# Patient Record
Sex: Male | Born: 1960 | Race: White | Hispanic: No | Marital: Married | Smoking: Former smoker
Health system: Southern US, Community
[De-identification: ages and names within clinical notes are randomized; demographics above are authoritative.]

## PROBLEM LIST (undated history)

## (undated) DIAGNOSIS — F319 Bipolar disorder, unspecified: Secondary | ICD-10-CM

## (undated) DIAGNOSIS — I10 Essential (primary) hypertension: Secondary | ICD-10-CM

## (undated) DIAGNOSIS — E119 Type 2 diabetes mellitus without complications: Secondary | ICD-10-CM

## (undated) DIAGNOSIS — F909 Attention-deficit hyperactivity disorder, unspecified type: Secondary | ICD-10-CM

## (undated) DIAGNOSIS — F419 Anxiety disorder, unspecified: Secondary | ICD-10-CM

## (undated) HISTORY — DX: Essential (primary) hypertension: I10

## (undated) HISTORY — PX: OTHER SURGICAL HISTORY: SHX169

## (undated) HISTORY — DX: Bipolar disorder, unspecified: F31.9

## (undated) HISTORY — DX: Anxiety disorder, unspecified: F41.9

## (undated) HISTORY — DX: Attention-deficit hyperactivity disorder, unspecified type: F90.9

## (undated) HISTORY — DX: Type 2 diabetes mellitus without complications: E11.9

## (undated) HISTORY — PX: BACK SURGERY: SHX140

---

## 2008-08-31 ENCOUNTER — Ambulatory Visit: Payer: Self-pay | Admitting: Family Medicine

## 2008-08-31 DIAGNOSIS — E785 Hyperlipidemia, unspecified: Secondary | ICD-10-CM

## 2008-08-31 DIAGNOSIS — G2581 Restless legs syndrome: Secondary | ICD-10-CM

## 2008-08-31 DIAGNOSIS — E119 Type 2 diabetes mellitus without complications: Secondary | ICD-10-CM

## 2008-08-31 DIAGNOSIS — I10 Essential (primary) hypertension: Secondary | ICD-10-CM | POA: Insufficient documentation

## 2008-08-31 DIAGNOSIS — F3181 Bipolar II disorder: Secondary | ICD-10-CM | POA: Insufficient documentation

## 2008-09-05 ENCOUNTER — Encounter: Payer: Self-pay | Admitting: Family Medicine

## 2008-09-06 LAB — CONVERTED CEMR LAB
Albumin: 4.2 g/dL (ref 3.5–5.2)
Alkaline Phosphatase: 55 units/L (ref 39–117)
BUN: 21 mg/dL (ref 6–23)
CO2: 23 meq/L (ref 19–32)
Cholesterol: 156 mg/dL (ref 0–200)
Glucose, Bld: 136 mg/dL — ABNORMAL HIGH (ref 70–99)
HDL: 25 mg/dL — ABNORMAL LOW (ref >39)
LDL Cholesterol: 54 mg/dL (ref 0–99)
Potassium: 4.3 meq/L (ref 3.5–5.3)
Total Protein: 6.9 g/dL (ref 6.0–8.3)
Triglycerides: 385 mg/dL — ABNORMAL HIGH (ref <150)

## 2008-09-13 ENCOUNTER — Ambulatory Visit: Payer: PRIVATE HEALTH INSURANCE | Admitting: Family Medicine

## 2008-09-17 ENCOUNTER — Encounter: Admission: RE | Admit: 2008-09-17 | Discharge: 2008-09-17 | Payer: Self-pay | Admitting: Family Medicine

## 2008-09-17 ENCOUNTER — Ambulatory Visit: Payer: Self-pay | Admitting: Family Medicine

## 2008-10-02 DIAGNOSIS — H409 Unspecified glaucoma: Secondary | ICD-10-CM | POA: Insufficient documentation

## 2008-10-05 ENCOUNTER — Ambulatory Visit: Payer: Self-pay | Admitting: Family Medicine

## 2008-10-05 DIAGNOSIS — R062 Wheezing: Secondary | ICD-10-CM | POA: Insufficient documentation

## 2008-10-16 ENCOUNTER — Ambulatory Visit: Payer: Self-pay | Admitting: Family Medicine

## 2009-07-26 IMAGING — CR DG CHEST 2V
2 series · 2 of 2 positions shown · non-contrast
Comparison: Prior studies.

CLINICAL DATA: Short of breath cough

CHEST - 2 VIEW

[view not recorded (1 of 2)]
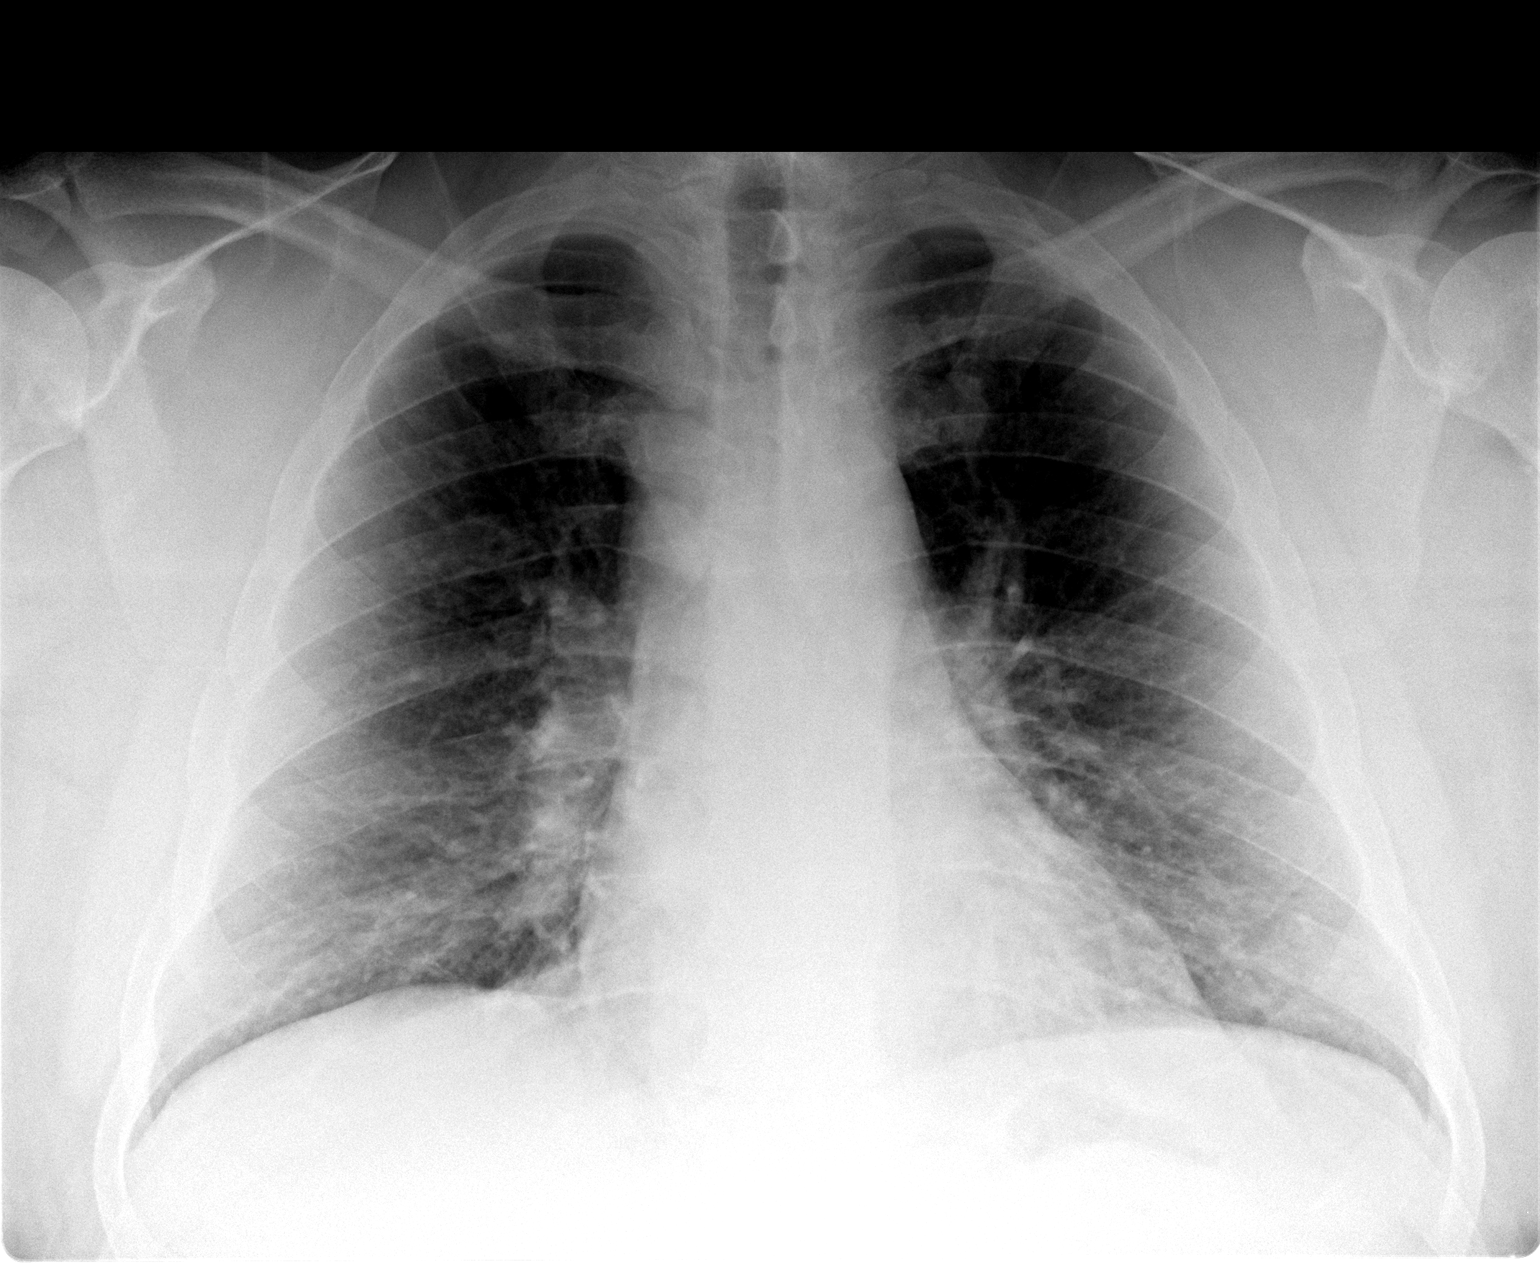

[view not recorded (2 of 2)]
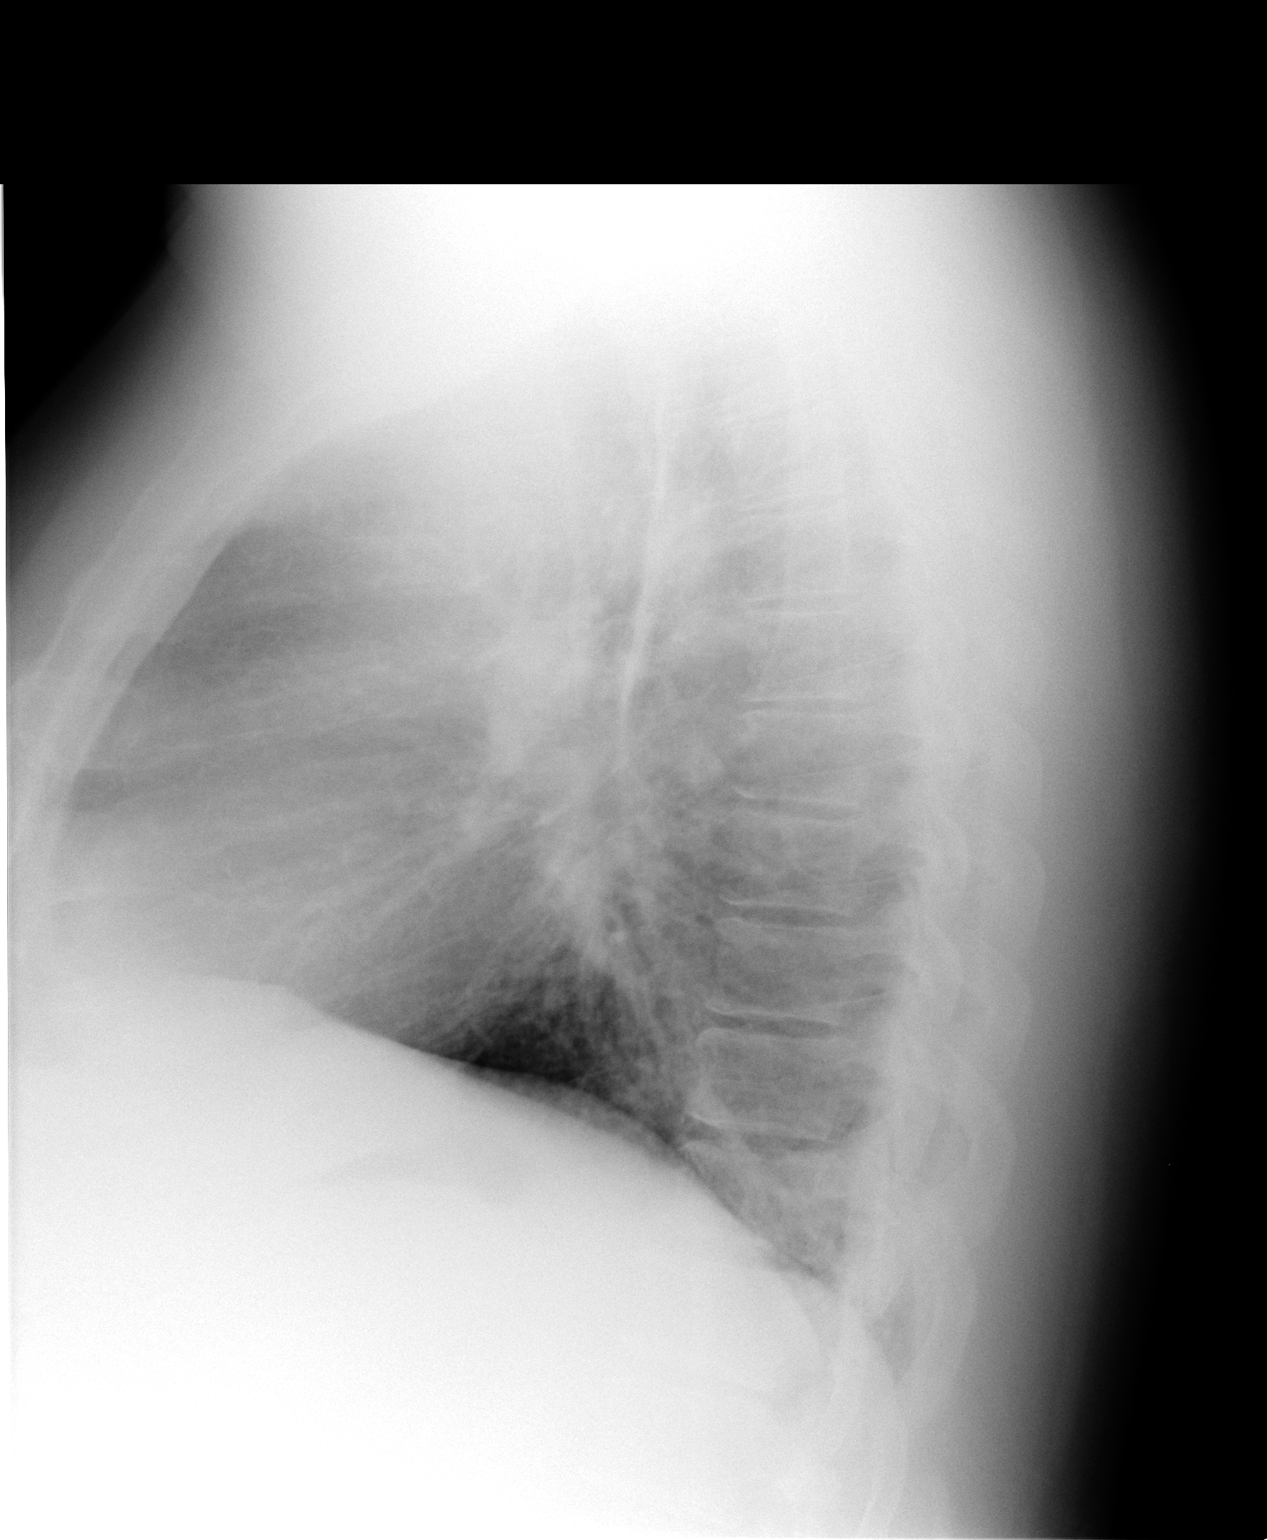

[2 of 2 positions shown; findings below may reference images not displayed]

FINDINGS: There are accentuated bronchial markings present most
consistent with bronchitic change.  There are no acute infiltrates.
There is slight fullness within the right paratracheal region.
Comparison with any prior chest films the patient might have would
be helpful.  This may be secondary to mediastinal fat.  The heart
and mediastinal structures are normal.
IMPRESSION: 1.  Bronchitic changes.
2.  Fullness within the right paratracheal region and superior
mediastinum may be due to mediastinal fat.  Comparison with any
prior films the patient might have would be helpful to exclude
mediastinal adenopathy.

## 2010-12-09 ENCOUNTER — Ambulatory Visit: Payer: Self-pay | Admitting: Family Medicine

## 2010-12-09 LAB — CONVERTED CEMR LAB: Creatinine,U: 100 mg/dL

## 2011-01-05 ENCOUNTER — Encounter: Payer: Self-pay | Admitting: Family Medicine

## 2011-01-06 LAB — CONVERTED CEMR LAB
ALT: 26 units/L (ref 0–53)
AST: 37 units/L (ref 0–37)
Albumin: 4.5 g/dL (ref 3.5–5.2)
CO2: 29 meq/L (ref 19–32)
Calcium: 9.5 mg/dL (ref 8.4–10.5)
Chloride: 95 meq/L — ABNORMAL LOW (ref 96–112)
Potassium: 4.8 meq/L (ref 3.5–5.3)
Sodium: 135 meq/L (ref 135–145)
Total Protein: 7 g/dL (ref 6.0–8.3)

## 2011-01-07 ENCOUNTER — Telehealth (INDEPENDENT_AMBULATORY_CARE_PROVIDER_SITE_OTHER): Payer: Self-pay | Admitting: *Deleted

## 2011-01-09 ENCOUNTER — Ambulatory Visit: Admit: 2011-01-09 | Payer: Self-pay | Admitting: Family Medicine

## 2011-01-22 NOTE — Assessment & Plan Note (Signed)
Summary: Re-estab care for DM, HTN, etc    Vital Signs:  Patient profile:   50 year old male Height:      70.25 inches Weight:      342 pounds Pulse rate:   58 / minute BP sitting:   156 / 86  (right arm) Cuff size:   large  Vitals Entered By: Avon Gully CMA, Duncan Dull) (December 09, 2010 9:12 AM) CC: discuss meds   Primary Care Provider:  Nani Gasser MD  CC:  discuss meds.  History of Present Illness:  Moved to [place] a year ago and now is back. That is why not seen in our office for a year.   Diabetes Management History:      The patient is a 50 years old male who comes in for evaluation of DM Type 2.  He states understanding of dietary principles but he is not following the appropriate diet.        Hypoglycemic symptoms are not occurring.  No hyperglycemic symptoms are reported.  Other comments include: Doesn't check sugars.  .        There are no symptoms to suggest diabetic complications.  Since his last visit, no infections have occurred.  No changes have been made to his treatment plan since last visit.     Current Medications (verified): 1)  Lovastatin 40 Mg Tabs (Lovastatin) .... Take 1 Tablet By Mouth Once A Day 2)  Lisinopril-Hydrochlorothiazide 20-25 Mg Tabs (Lisinopril-Hydrochlorothiazide) .... Take 1 Tablet By Mouth Once A Day 3)  Furosemide 20 Mg Tabs (Furosemide) .... Take 1 Tablet By Mouth Once A Day 4)  Metformin Hcl 750 Mg Xr24h-Tab (Metformin Hcl) .... Take 1 Tablet By Mouth Three Times A Day 5)  Oxcarbazepine 600 Mg Tabs (Oxcarbazepine) .... Take 1 Tablet By Mouth Three Times A Day 6)  Tricor 145 Mg Tabs (Fenofibrate) .... Take 1 Tablet By Mouth Once A Day 7)  Lovaza 1 Gm Caps (Omega-3-Acid Ethyl Esters) .... 4 Tabs By Mouth Daily  Allergies (verified): No Known Drug Allergies  Comments:  Nurse/Medical Assistant: The patient's medications and allergies were reviewed with the patient and were updated in the Medication and Allergy  Lists. Avon Gully CMA, Duncan Dull) (December 09, 2010 9:18 AM)  Physical Exam  General:  Well-developed,well-nourished,in no acute distress; alert,appropriate and cooperative throughout examination Lungs:  Normal respiratory effort, chest expands symmetrically. Lungs are clear to auscultation, no crackles or wheezes. Heart:  Normal rate and regular rhythm. S1 and S2 normal without gallop, murmur, click, rub or other extra sounds. Skin:  no rashes.   Cervical Nodes:  No lymphadenopathy noted Psych:  Cognition and judgment appear intact. Alert and cooperative with normal attention span and concentration. No apparent delusions, illusions, hallucinations  Diabetes Management Exam:    Foot Exam (with socks and/or shoes not present):       Sensory-Monofilament:          Left foot: normal          Right foot: normal   Impression & Recommendations:  Problem # 1:  DIABETES MELLITUS, TYPE II (ICD-250.00) Assessment Deteriorated Says he plans on really working on his diet.  normal monofilament examfrom today A1C isnot well controlled. Change to kombiglyze As he canges his diet and starts to exercise he needs to wean the gimiperide.  F/U in one month. needs to check sugars more regularly.  Neg microalbumin.  Flu vac given today.  The following medications were removed from the medication list:  Aspirin Adult Low Strength 81 Mg Tbec (Aspirin) .Marland Kitchen... Take 1 tablet by mouth once a day His updated medication list for this problem includes:    Lisinopril-hydrochlorothiazide 20-25 Mg Tabs (Lisinopril-hydrochlorothiazide) .Marland Kitchen... Take 1 tablet by mouth once a day    Kombiglyze Xr 04-999 Mg Xr24h-tab (Saxagliptin-metformin) .Marland Kitchen... Take 1 tablet by mouth once a day    Glimepiride 4 Mg Tabs (Glimepiride) .Marland Kitchen... 2 tablets by mouth daily.  Orders: Fingerstick ([address]109) Creatinine  (60454) Hemoglobin A1C (09811) Urine Microalbumin (91478) T-Comprehensive Metabolic Panel (29562-13086) T-Lipid Profile  (57846-96295)  Problem # 2:  ESSENTIAL HYPERTENSION, BENIGN (ICD-401.1) Assessment: Deteriorated NOt at goal on current regimen. Will send in refills and recheck in one month. If not at goal will add a betablocker.  His updated medication list for this problem includes:    Lisinopril-hydrochlorothiazide 20-25 Mg Tabs (Lisinopril-hydrochlorothiazide) .Marland Kitchen... Take 1 tablet by mouth once a day    Furosemide 20 Mg Tabs (Furosemide) .Marland Kitchen... Take 1 tablet by mouth once a day  BP today: 156/86 Prior BP: 132/71 (10/16/2008)  Labs Reviewed: K+: 4.3 (09/05/2008) Creat: : 1.46 (09/05/2008)   Chol: 156 (09/05/2008)   HDL: 25 (09/05/2008)   LDL: 54 (09/05/2008)   TG: 385 (09/05/2008)  Complete Medication List: 1)  Lovastatin 40 Mg Tabs (Lovastatin) .... Take 1 tablet by mouth once a day 2)  Lisinopril-hydrochlorothiazide 20-25 Mg Tabs (Lisinopril-hydrochlorothiazide) .... Take 1 tablet by mouth once a day 3)  Furosemide 20 Mg Tabs (Furosemide) .... Take 1 tablet by mouth once a day 4)  Kombiglyze Xr 04-999 Mg Xr24h-tab (Saxagliptin-metformin) .... Take 1 tablet by mouth once a day 5)  Oxcarbazepine 600 Mg Tabs (Oxcarbazepine) .... Take 1 tablet by mouth three times a day 6)  Tricor 145 Mg Tabs (Fenofibrate) .... Take 1 tablet by mouth once a day 7)  Lovaza 1 Gm Caps (Omega-3-acid ethyl esters) .... 4 tabs by mouth daily 8)  Glimepiride 4 Mg Tabs (Glimepiride) .... 2 tablets by mouth daily.  Other Orders: Flu Vaccine 79yrs + (28413) Admin 1st Vaccine (24401)  Patient Instructions: 1)  If your sugars drop into the 130 then stop one glimiperide. If still doing well then stop the second glimiperide.  2)  Follow up in one months for blood pressure and sugars. Please bring in your meter with you. Prescriptions: LOVAZA 1 GM CAPS (OMEGA-3-ACID ETHYL ESTERS) 4 tabs by mouth daily  #360 Capsule x 3   Entered and Authorized by:   Nani Gasser MD   Signed by:   Nani Gasser MD on 12/09/2010    Method used:   [facility]        [facility] [address] [phone]* (retail)       [address]       [place], [place]  [address]       Ph: [phone] or [phone]       Fax: [phone]   RxID:   [id number] TRICOR 145 MG TABS (FENOFIBRATE) Take 1 tablet by mouth once a day  #90 x 3   Entered and Authorized by:   Nani Gasser MD   Signed by:   Nani Gasser MD on 12/09/2010   Method used:   [facility]        [facility] [phone]* (retail)       [address]       [place], [place]  [address]       Ph: [phone] or [phone]  Fax: [phone]   RxID:   [id number] FUROSEMIDE 20 MG TABS (FUROSEMIDE) Take 1 tablet by mouth once a day  #90 x 3   Entered and Authorized by:   Nani Gasser MD   Signed by:   Nani Gasser MD on 12/09/2010   Method used:   [facility]        [facility] [address] [phone]* (retail)       [address]       [place], [place]  [address]       Ph: [phone] or [phone]       Fax: [phone]   RxID:   [id number] LISINOPRIL-HYDROCHLOROTHIAZIDE 20-25 MG TABS (LISINOPRIL-HYDROCHLOROTHIAZIDE) Take 1 tablet by mouth once a day  #90 x 3   Entered and Authorized by:   Nani Gasser MD   Signed by:   Nani Gasser MD on 12/09/2010   Method used:   [facility]ically [facility]        [facility] [address] [phone]* (retail)       [address]       [place], [place]  [address]       Ph: [phone] or [phone]       Fax: [phone]   RxID:   [id number] LOVASTATIN 40 MG TABS (LOVASTATIN) Take 1 tablet by mouth once a day  #90 x 3   Entered and Authorized by:   Nani Gasser MD   Signed by:   Nani Gasser MD on 12/09/2010   Method used:   [facility]        [facility] [address] [phone]* (retail)       [address]       [place], [place]  [address]       Ph: [phone] or [phone]       Fax: [phone]   RxID:   [id number] OXCARBAZEPINE 600 MG TABS (OXCARBAZEPINE)  Take 1 tablet by mouth three times a day  #270 x 0   Entered and Authorized by:   Nani Gasser MD   Signed by:   Nani Gasser MD on 12/09/2010   Method used:   [facility]        [facility] [address] [phone]* (retail)       [address]       [place], [place]  [address]       Ph: [phone] or [phone]       Fax: [phone]   RxID:   [id number] OXCARBAZEPINE 600 MG TABS (OXCARBAZEPINE) Take 1 tablet by mouth three times a day  #90 x 2   Entered and Authorized by:   Nani Gasser MD   Signed by:   Nani Gasser MD on 12/09/2010   Method used:   [facility]        [facility] [address] [phone]* (retail)       [address]       [place], [place]  [address]       Ph: [phone] or [phone]       Fax: [phone]   RxID:   [id number] KOMBIGLYZE XR 04-999 MG XR24H-TAB (SAXAGLIPTIN-METFORMIN) Take 1 tablet by mouth once a day  #30 x 2   Entered and Authorized by:   Nani Gasser MD   Signed by:   Nani Gasser MD on 12/09/2010   Method used:   [facility] to        [facility] [address] [phone]* (retail)  [address]       [place], [place]  [address]       Ph: [phone] or [phone]       Fax: [phone]   RxID:   [id number]    Orders Added: 1)  Fingerstick [36416] 2)  Creatinine  [82570] 3)  Hemoglobin A1C [83036] 4)  Urine Microalbumin [82044] 5)  T-Comprehensive Metabolic Panel [80053-22900] 6)  T-Lipid Profile [80061-22930] 7)  Flu Vaccine 53yrs + [90658] 8)  Admin 1st Vaccine [90471] 9)  Est. Patient Level IV [33295]   Immunizations Administered:  Influenza Vaccine # 1:    Vaccine Type: Fluvax 3+    Site: left deltoid    Mfr: Sanofi Pasteur    Dose: 0.5 ml    Route: IM    Given by: Sue Lush McCrimmon CMA, (AAMA)    Exp. Date: 05/22/2011    Lot #: [id number]    VIS given: 07/15/10 version given December 09, 2010.  Flu Vaccine Consent Questions:    Do you have a history of severe allergic  reactions to this vaccine? no    Any prior history of allergic reactions to egg and/or gelatin? no    Do you have a sensitivity to the preservative Thimersol? no    Do you have a past history of Guillan-Barre Syndrome? no    Do you currently have an acute febrile illness? no    Have you ever had a severe reaction to latex? no    Vaccine information given and explained to patient? no   Immunizations Administered:  Influenza Vaccine # 1:    Vaccine Type: Fluvax 3+    Site: left deltoid    Mfr: Sanofi Pasteur    Dose: 0.5 ml    Route: IM    Given by: Sue Lush McCrimmon CMA, (AAMA)    Exp. Date: 05/22/2011    Lot #: [id number]    VIS given: 07/15/10 version given December 09, 2010.  Laboratory Results   Urine Tests  Date/Time Received: 12/09/10 Date/Time Reported: 12/09/10  Microalbumin (urine): 30 mg/L Creatinine: 100mg /dL  A:C Ratio 30mg /g  Blood Tests   Date/Time Received: 12/09/10 Date/Time Reported: 12/09/10  HGBA1C: 8.4%   (Normal Range: Non-Diabetic - 3-6%   Control Diabetic - 6-8%)         Appended Document: Re-estab care for DM, HTN, etc     Past History:  Past Medical History: Last updated: 08/31/2008 Bipolar - more manic predominant ADD  Past Surgical History: Last updated: 08/31/2008 Kidney stones removed from ureter at age 73  Family History: Last updated: 08/31/2008 Father prostate Ca late 11s, Hi chol MGM with MI Mother with hi cholesterol, HTN  Social History: Last updated: 08/31/2008 Quarry manager at [employer].  14 rs of education.  Married to [place].  Former Smoker Alcohol use-no Drug use-no

## 2011-01-22 NOTE — Progress Notes (Signed)
Summary: rx. request   Phone Note Call from Patient   Caller: Patient Summary of Call: Needs a Rx. for Test strips and a meter sent to [facility] ... He said he requested this to be completed yesterday and it wasn't completed because he went to his pharmacy and it was not there... Please do as soon as possible and let patient know.. Thanks.[name]  January 07, 2011 12:39 PM  Initial call taken by: [name],  January 07, 2011 12:40 PM    Prescriptions: GLUCOMETER Strips and lancets to test 2 x a day  Dx 250.02  #90 days supp x 3   Entered by:   Payton Spark CMA   Authorized by:   Nani Gasser MD   Signed by:   Payton Spark CMA on 01/07/2011   Method used:   Printed then faxed to ...       [facility] [address] [phone]* (retail)       [address] [place], [place]  [address]       Ph: [phone] or [phone]       Fax: [phone]   RxID:   [id number]    This was faxed yesterday and will be faxed AGAIN today!!

## 2011-03-04 ENCOUNTER — Ambulatory Visit (HOSPITAL_COMMUNITY): Payer: Self-pay | Admitting: Psychiatry

## 2011-03-09 ENCOUNTER — Telehealth: Payer: Self-pay | Admitting: Family Medicine

## 2011-03-16 ENCOUNTER — Other Ambulatory Visit: Payer: Self-pay | Admitting: Family Medicine

## 2011-03-16 MED ORDER — SAXAGLIPTIN-METFORMIN ER 5-1000 MG PO TB24: ORAL_TABLET | ORAL | Status: DC

## 2011-03-19 NOTE — Progress Notes (Signed)
  Phone Note Refill Request Message from:  Fax from Pharmacy on March 09, 2011 4:47 PM  Refills Requested: Medication #1:  KOMBIGLYZE XR 04-999 MG XR24H-TAB Take 1 tablet by mouth once a day Initial call taken by: Avon Gully CMA, Duncan Dull),  March 09, 2011 4:47 PM    Prescriptions: KOMBIGLYZE XR 04-999 MG XR24H-TAB (SAXAGLIPTIN-METFORMIN) Take 1 tablet by mouth once a day  #30 x 0   Entered by:   Avon Gully CMA, (AAMA)   Authorized by:   Nani Gasser MD   Signed by:   Avon Gully CMA, (AAMA) on 03/09/2011   Method used:   [facility]        [facility] [phone]* (retail)       [address] [place], [place]  [address]       Ph: [phone] or [phone]       Fax: [phone]   RxID:   [id number]

## 2011-04-14 ENCOUNTER — Ambulatory Visit (HOSPITAL_COMMUNITY): Payer: Self-pay | Admitting: Psychiatry

## 2011-04-15 ENCOUNTER — Other Ambulatory Visit: Payer: Self-pay | Admitting: Family Medicine

## 2011-05-07 ENCOUNTER — Other Ambulatory Visit: Payer: Self-pay | Admitting: Family Medicine

## 2011-05-13 ENCOUNTER — Other Ambulatory Visit: Payer: Self-pay | Admitting: Family Medicine

## 2011-05-25 ENCOUNTER — Ambulatory Visit (INDEPENDENT_AMBULATORY_CARE_PROVIDER_SITE_OTHER): Payer: Self-pay | Admitting: Physician Assistant

## 2011-05-25 DIAGNOSIS — F3130 Bipolar disorder, current episode depressed, mild or moderate severity, unspecified: Secondary | ICD-10-CM

## 2011-06-09 ENCOUNTER — Ambulatory Visit (HOSPITAL_COMMUNITY): Payer: Self-pay | Admitting: Psychiatry

## 2011-06-22 ENCOUNTER — Encounter (INDEPENDENT_AMBULATORY_CARE_PROVIDER_SITE_OTHER): Payer: PRIVATE HEALTH INSURANCE | Admitting: Physician Assistant

## 2011-06-22 DIAGNOSIS — F339 Major depressive disorder, recurrent, unspecified: Secondary | ICD-10-CM

## 2011-07-10 ENCOUNTER — Ambulatory Visit (INDEPENDENT_AMBULATORY_CARE_PROVIDER_SITE_OTHER): Payer: PRIVATE HEALTH INSURANCE | Admitting: Behavioral Health

## 2011-07-10 DIAGNOSIS — F39 Unspecified mood [affective] disorder: Secondary | ICD-10-CM

## 2011-07-24 ENCOUNTER — Encounter (INDEPENDENT_AMBULATORY_CARE_PROVIDER_SITE_OTHER): Payer: PRIVATE HEALTH INSURANCE | Admitting: Behavioral Health

## 2011-07-24 DIAGNOSIS — F319 Bipolar disorder, unspecified: Secondary | ICD-10-CM

## 2011-08-21 ENCOUNTER — Encounter (INDEPENDENT_AMBULATORY_CARE_PROVIDER_SITE_OTHER): Payer: PRIVATE HEALTH INSURANCE | Admitting: Behavioral Health

## 2011-08-21 DIAGNOSIS — F319 Bipolar disorder, unspecified: Secondary | ICD-10-CM

## 2011-09-14 ENCOUNTER — Encounter (INDEPENDENT_AMBULATORY_CARE_PROVIDER_SITE_OTHER): Payer: PRIVATE HEALTH INSURANCE | Admitting: Physician Assistant

## 2011-09-14 DIAGNOSIS — F309 Manic episode, unspecified: Secondary | ICD-10-CM

## 2011-10-12 ENCOUNTER — Encounter (HOSPITAL_COMMUNITY): Payer: Self-pay | Admitting: Behavioral Health

## 2011-10-12 ENCOUNTER — Encounter (INDEPENDENT_AMBULATORY_CARE_PROVIDER_SITE_OTHER): Payer: PRIVATE HEALTH INSURANCE | Admitting: Behavioral Health

## 2011-10-12 DIAGNOSIS — F39 Unspecified mood [affective] disorder: Secondary | ICD-10-CM

## 2011-10-29 ENCOUNTER — Encounter (HOSPITAL_COMMUNITY): Payer: Self-pay | Admitting: Psychology

## 2011-11-11 ENCOUNTER — Ambulatory Visit (INDEPENDENT_AMBULATORY_CARE_PROVIDER_SITE_OTHER): Payer: PRIVATE HEALTH INSURANCE | Admitting: Behavioral Health

## 2011-11-11 ENCOUNTER — Encounter (HOSPITAL_COMMUNITY): Payer: Self-pay | Admitting: Behavioral Health

## 2011-11-11 DIAGNOSIS — F319 Bipolar disorder, unspecified: Secondary | ICD-10-CM

## 2011-11-16 ENCOUNTER — Encounter (HOSPITAL_COMMUNITY): Payer: Self-pay | Admitting: Behavioral Health

## 2011-11-16 NOTE — Progress Notes (Signed)
   THERAPIST PROGRESS NOTE  Session Time: 3:00  Participation Level: Active  Behavioral Response: Well GroomedAlertAnxious  Type of Therapy: Family Therapy  Treatment Goals addressed: Communication: coping  Interventions: Family Systems  Summary: Cody Barnett is a 50 y.o. male who presents with Bi-polar disorder.   Suicidal/Homicidal: Nowithout intent/plan  Therapist Response: I met with the clients, his wife, and his wife's therapist to discuss communication and marital issues. The client and his wife have been discussing communication issues within the marriage and whether to continue with the marriage. Class wife requested a joint session to discuss her concerns and both the clients wife and her therapist with a good that we all meet together. The session did appear to be productive with both client and his wife being able to provide input [name], the wife's therapist [name] insight and made suggestions or productive ways to make changes in the relationship. Both client and his wife appear to be open to those changes. Majority of the session focused on communication barriers in individual perceptions which in turn had influence on the client reaction to his wife and her reaction to him. The wife's therapist intervened on several occasions to interrupt nonproductive interactions and communication.  Plan: Return again in 2 weeks.  Diagnosis: Axis I: Bipolar, mixed    Axis II: Deferred    [name], [facility] 11/16/2011

## 2011-11-23 ENCOUNTER — Ambulatory Visit (INDEPENDENT_AMBULATORY_CARE_PROVIDER_SITE_OTHER): Payer: PRIVATE HEALTH INSURANCE | Admitting: Psychiatry

## 2011-11-23 ENCOUNTER — Encounter (HOSPITAL_COMMUNITY): Payer: Self-pay | Admitting: Psychiatry

## 2011-11-23 VITALS — BP 141/84 | HR 86 | Ht 71.0 in | Wt 312.0 lb

## 2011-11-23 DIAGNOSIS — Z139 Encounter for screening, unspecified: Secondary | ICD-10-CM

## 2011-11-23 DIAGNOSIS — E1169 Type 2 diabetes mellitus with other specified complication: Secondary | ICD-10-CM

## 2011-11-23 DIAGNOSIS — E119 Type 2 diabetes mellitus without complications: Secondary | ICD-10-CM

## 2011-11-23 DIAGNOSIS — E669 Obesity, unspecified: Secondary | ICD-10-CM

## 2011-11-23 DIAGNOSIS — F3131 Bipolar disorder, current episode depressed, mild: Secondary | ICD-10-CM

## 2011-11-23 DIAGNOSIS — F909 Attention-deficit hyperactivity disorder, unspecified type: Secondary | ICD-10-CM

## 2011-11-23 DIAGNOSIS — F902 Attention-deficit hyperactivity disorder, combined type: Secondary | ICD-10-CM

## 2011-11-23 MED ORDER — METHYLPHENIDATE HCL ER (OSM) 27 MG PO TBCR: 27.0000 mg | EXTENDED_RELEASE_TABLET | Freq: Every day | ORAL | Status: DC

## 2011-11-23 MED ORDER — OXCARBAZEPINE 600 MG PO TABS: ORAL_TABLET | ORAL | Status: DC

## 2011-11-23 MED ORDER — LAMOTRIGINE 100 MG PO TABS: 100.0000 mg | ORAL_TABLET | Freq: Two times a day (BID) | ORAL | Status: DC

## 2011-11-23 NOTE — Patient Instructions (Signed)
Continue withLlamical and Trileptal and StartC Discussion of Concerta   Risks, Benefits and Alternative Treatments  are discussed and pt  agreed  Call if Concerta  demonstrates any side effects.   Suicide risk assessment - minimal  "It's never been an option" Return In 5 weeks  Jan 4th 2013

## 2011-11-23 NOTE — Progress Notes (Signed)
Psychiatric Assessment Adult  Patient Identification:  Cody Barnett Date of Evaluation:  11/23/2011 Chief Complaint: some irritability, some impulsivity History of Chief Complaint:   Chief Complaint  Patient presents with  . Manic Behavior    HPI Review of Systems Physical Exam  Depressive Symptoms: depressed mood and weight gain  (Hypo) Manic Symptoms:   Elevated Mood:  No Irritable Mood:  Yes Grandiosity:  No Distractibility:  Yes Labiality of Mood:  Yes Delusions:  No Hallucinations:  No Impulsivity:  No Sexually Inappropriate Behavior:  No Financial Extravagance:  No Flight of Ideas:  No  Anxiety Symptoms: Excessive Worry:  Yes Panic Symptoms:  No Agoraphobia:  No Obsessive Compulsive: No  Symptoms: 0 Specific Phobias:  No Social Anxiety:  No  Psychotic Symptoms:  Hallucinations: No None Delusions:  No Paranoia:  No   Ideas of Reference:  No  PTSD Symptoms: Ever had a traumatic exposure:  No Had a traumatic exposure in the last month:  No Re-experiencing: No None Hypervigilance:  No Hyperarousal: No None Avoidance: No None  Traumatic Brain Injury: No 0  Past Psychiatric History: Diagnosis: Bipolar, ADHD, ADD current  Hospitalizations: 0  Outpatient Care: [facility]  Substance Abuse Care: Remote  Self-Mutilation: 0  Suicidal Attempts: 0  Violent Behaviors: extreme anger   Past Medical History:   Past Medical History  Diagnosis Date  . Bipolar disorder   . Anxiety    History of Loss of Consciousness:  No Seizure History:  No Cardiac History:  No Allergies:  No Known Allergies Current Medications:  Current Outpatient Prescriptions  Medication Sig Dispense Refill  . KOMBIGLYZE XR 04-999 MG TB24       . lamoTRIgine (LAMICTAL) 100 MG tablet Take 1 tablet (100 mg total) by mouth 2 (two) times daily.  60 tablet  0  . LOVAZA 1 G capsule       . oxcarbazepine (TRILEPTAL) 600 MG tablet TAKE 1/ 12/ 2 x DAILY  270 tablet  0  . methylphenidate  (CONCERTA) 27 MG CR tablet Take 1 tablet (27 mg total) by mouth daily.  30 tablet  0    Previous Psychotropic Medications:  Medication Dose   Desipramine  ?  Nortriptyline ?  Wellbutrin ?               Substance Abuse History in the last 12 months: Substance Age of 1st Use Last Use Amount Specific Type  Nicotine      Alcohol      Cannabis      Opiates      Cocaine      Methamphetamines      LSD      Ecstasy      Benzodiazepines      Caffeine      Inhalants      Others:                             Social History: Current Place of Residence:Place of Birth: [place]  Family Members: 4 Marital Status:  Married Children: 2  Sons: 1  Daughters: 1 Relationships: ex-wife and older son, never treated for ADHD Education:  unknown Educational Problems/Performance: affected by ADHD Religious Beliefs/Practices: hyperactive, treated at [facility] History of Abuse: unknown Pharmacist, hospital History: unknown Legal History: unknown Hobbies/Interests: computers  Family History:   Family History  Problem Relation Age of Onset  . ADD / ADHD Mother   . ADD /  ADHD Brother   . ADD / ADHD Son   . ADD / ADHD Son   . ADD / ADHD Daughter     Mental Status Examination/Evaluation: Objective:  Appearance: Fairly Groomed  Eye Contact::  Fair  Speech:  Normal Rate  Volume:  Normal  Mood:  Early guarded, then more relaxed  Affect:  Full Range  Thought Process:  Goal Directed  Orientation:  Full  Thought Content:  organized normal  Suicidal Thoughts:  No  Homicidal Thoughts:  No  Judgement:  Intact  Insight:  Fair  Psychomotor Activity:  Normal  Akathisia:  No  Handed:  Right  AIMS (if indicated):  0  Assets:  Communication Skills Desire for Improvement Financial Resources/Insurance Housing Intimacy Social Support Vocational/Educational    Laboratory/X-Ray Psychological Evaluation(s)   FASTING CBC, CMET, LIPIDS, Oxcarbazepine level,  HgbA1c     Assessment:  Pt is seen by CP and with Wife KL.  He is morbidly obese, in casual clothes and initially avoids eye contact.  He is aware of his medications and says he takes them.  He is aware that he can be very irritable when he skips doses of mood regulation medications.  He says he has bipolar mood swings.  Without meds he can be very agitated.  He describes his depression as mild.  He invites option to treat his ADD, once ADHD, very hyperactive as a child.  He was treated at [facility].  He has not taken medication recently but has taken Wellbutrin Nortipryline Desipramine earlier He agrees to an increase in Lamictal to prolong recurrence of depression and no change in Trileptal.  He denies tobacco, alcohol, drugs.  He denies suicidal thoughts.  He works for a Engineer, maintenance (IT).  He is married to [place] Barnett, second marriage and has 2 adopted children, both Medical sales representative.  He agrees to start a low dose Concerta to test his tolerability.  If agreeable, the dose will be gradually increased.  He admits to impulsivity, action-wise and verbally.  He says he has high blood pressure and diabetes; high cholesterol too.  He ends session, fairly agreeable and agrees to get his FASTING blood test Dec. 28, 2012             AXIS I ADHD, combined type,Bipolar -irritable/depressed Disorder  AXIS II Deferred  AXIS III Past Medical History  Diagnosis Date  . Bipolar disorder   . Anxiety   Morbid obesity, hypercholesterolemia, hypertension, diabetes type 2,   AXIS IV problems related to social environment  AXIS V 51-60 moderate symptoms   Treatment Plan/Recommendations:  Plan of Care: Check Trileptal level, advise about weight reduction, monitor moods and ADD symptoms;  Medication management, individual and couple therapy  Laboratory:  CBC Chemistry Profile HbAIC Lipids, oxcarbazepine level  Psychotherapy: CP and KL  Medications: Trileptal, Lamictal, Concerta  Routine PRN  Medications:  No  Consultations: 0  Safety Concerns:  Minimal; denies SI/HI  Other:      Mickeal Skinner, MD 12/3/20125:00 PM

## 2011-11-26 ENCOUNTER — Ambulatory Visit (INDEPENDENT_AMBULATORY_CARE_PROVIDER_SITE_OTHER): Payer: PRIVATE HEALTH INSURANCE | Admitting: Behavioral Health

## 2011-11-26 DIAGNOSIS — F319 Bipolar disorder, unspecified: Secondary | ICD-10-CM

## 2011-11-27 ENCOUNTER — Encounter (HOSPITAL_COMMUNITY): Payer: Self-pay | Admitting: Behavioral Health

## 2011-11-27 NOTE — Progress Notes (Signed)
   THERAPIST PROGRESS NOTE  Session Time: 11:00  Participation Level: Active  Behavioral Response: Fairly GroomedAlertAnxious  Type of Therapy: Individual Therapy  Treatment Goals addressed: Anxiety  Interventions: CBT  Summary: Cody Barnett is a 50 y.o. male who presents with bi-polar disorder.   Suicidal/Homicidal: Nowithout intent/plan  Therapist Response: Previous session with the client involved [name] his wife's therapist and his wife. We spent some time reflecting on that session. He indicated that he felt was productive and that they have found some ways to better communicate with each other. He does indicate that to process and is taking some time for him to realize his deficiencies and indicates that his time he is much more aware of his wife's deficiencies as opposed to his own. He did indicate that they're beginning to address whether financial issues. He reported that they have gotten themselves into debt with credit cards and he is now setting aside a percentage of his and his wife's check to be in pain down the credit card. He realizes that financial stress is a big part of what is going on with he and his wife. He indicates that he and his wife have 2 different types of anger were as his wife is very verbal he is physical. He indicates that he hits whereas or does things to get his anger out quickly and then typically he's done with it. He recognizes this is not healthy and the impact it is having on his family as well as his children. He indicated he is not physical toward his family. He indicates that his wife's anger is more verbal and he feels that she allows herself to become too angry too quickly and often on Saturdays which is toward a yells at their 2 children if they are not doing chores quickly enough. He indicates that he feels at times fearful of her because he feels he cannot say anything to her without her becoming angry. We talked about ways based on  the last session how he can communicate in a healthy way how he is feeling about his wife's anger while at the same time acknowledging he is to her. We spent significant time again talking about improving communication and taking a look at perspective peer also recommended again that he read the book and boundaries. He indicated that he thinks he will download that book to both his and his wife's tablets for Christmas and jokingly said he would address them from this counselor and his wife counselor. I also recommended Sundra Aland as far as sound financial advice from a spiritual perspective  Plan: Return again in 2 weeks.  Diagnosis: Axis I: Bipolar, mixed    Axis II: Deferred    Khyrie Masi M, LPC 11/27/2011

## 2011-11-30 ENCOUNTER — Telehealth: Payer: Self-pay | Admitting: Family Medicine

## 2011-11-30 NOTE — Telephone Encounter (Signed)
Patient called scheduled an appointment for 12/09/11 for med management but request to have a lab order for standard labs,diabeties faxed over to [phone] he can get the labs done free as long as the order is faxed there.Marland KitchenMarland KitchenMarland Kitchen

## 2011-12-04 ENCOUNTER — Telehealth: Payer: Self-pay | Admitting: *Deleted

## 2011-12-04 NOTE — Telephone Encounter (Signed)
Pt has an appt scheduled for DM f/u next week and wants labs done at another lab because it will be free for him;it appears that Dr. Ferol Luz already put in an order for him

## 2011-12-08 ENCOUNTER — Encounter: Payer: Self-pay | Admitting: Family Medicine

## 2011-12-09 ENCOUNTER — Ambulatory Visit (INDEPENDENT_AMBULATORY_CARE_PROVIDER_SITE_OTHER): Payer: PRIVATE HEALTH INSURANCE | Admitting: Family Medicine

## 2011-12-09 ENCOUNTER — Encounter: Payer: Self-pay | Admitting: Family Medicine

## 2011-12-09 DIAGNOSIS — E119 Type 2 diabetes mellitus without complications: Secondary | ICD-10-CM

## 2011-12-09 DIAGNOSIS — Z23 Encounter for immunization: Secondary | ICD-10-CM

## 2011-12-09 DIAGNOSIS — Z1211 Encounter for screening for malignant neoplasm of colon: Secondary | ICD-10-CM

## 2011-12-09 LAB — POCT GLYCOSYLATED HEMOGLOBIN (HGB A1C): Hemoglobin A1C: 11.8

## 2011-12-09 LAB — POCT UA - MICROALBUMIN

## 2011-12-09 MED ORDER — SAXAGLIPTIN-METFORMIN ER 5-1000 MG PO TB24: 1.0000 | ORAL_TABLET | Freq: Every day | ORAL | Status: DC

## 2011-12-09 MED ORDER — EXENATIDE ER 2 MG ~~LOC~~ SUSR: 2.0000 mg | SUBCUTANEOUS | Status: DC

## 2011-12-09 NOTE — Progress Notes (Signed)
  Subjective:    Patient ID: Cody Barnett, male    DOB: January 12, 1961, 50 y.o.   MRN: [id number]  Diabetes He presents for his follow-up diabetic visit. He has type 2 diabetes mellitus. His disease course has been stable. There are no hypoglycemic associated symptoms. Pertinent negatives for diabetes include no blurred vision, no foot paresthesias, no foot ulcerations, no polydipsia, no polyphagia, no polyuria, no visual change and no weight loss. There are no hypoglycemic complications. Symptoms are stable. Current diabetic treatment includes oral agent (dual therapy). He is compliant with treatment all of the time. He is following a generally healthy diet. He has not had a previous visit with a dietician. He participates in exercise weekly. There is no change (not checking his sugar.  ) in his home blood glucose trend. An ACE inhibitor/angiotensin II receptor blocker is being taken. Eye exam is current.    Review of Systems  Constitutional: Negative for weight loss.  Eyes: Negative for blurred vision.  Genitourinary: Negative for polyuria.  Hematological: Negative for polydipsia and polyphagia.       Objective:   Physical Exam  Constitutional: He is oriented to person, place, and time. He appears well-developed and well-nourished.  HENT:  Head: Normocephalic and atraumatic.  Cardiovascular: Normal rate, regular rhythm and normal heart sounds.   Pulmonary/Chest: Effort normal and breath sounds normal.  Neurological: He is alert and oriented to person, place, and time.  Skin: Skin is warm and dry.  Psychiatric: He has a normal mood and affect. His behavior is normal.          Assessment & Plan:  DM-his diabetes is very poorly controlled. We discussed getting back onto a low carb and sugar diet. Also getting more regular exercise that he is doing some walking pretty routinely. We also discussed starting byderon once weekly injection. H.O given.  F/U in 1 mo.  Rember to get eye exam  in Jan.  Foot exam performed today and microalbumin.  Lab Results  Component Value Date   HGBA1C 11.8 12/09/2011   Tdap and flu given today.    Discussed need for colonscopy. Will make referral.

## 2011-12-25 ENCOUNTER — Encounter (HOSPITAL_COMMUNITY): Payer: Self-pay | Admitting: Psychiatry

## 2011-12-25 ENCOUNTER — Telehealth: Payer: Self-pay | Admitting: *Deleted

## 2011-12-25 ENCOUNTER — Ambulatory Visit (HOSPITAL_COMMUNITY): Payer: Self-pay | Admitting: Psychiatry

## 2011-12-25 ENCOUNTER — Ambulatory Visit (INDEPENDENT_AMBULATORY_CARE_PROVIDER_SITE_OTHER): Payer: PRIVATE HEALTH INSURANCE | Admitting: Psychiatry

## 2011-12-25 VITALS — BP 134/84 | HR 87 | Ht 71.0 in | Wt 314.0 lb

## 2011-12-25 DIAGNOSIS — F902 Attention-deficit hyperactivity disorder, combined type: Secondary | ICD-10-CM

## 2011-12-25 DIAGNOSIS — F3175 Bipolar disorder, in partial remission, most recent episode depressed: Secondary | ICD-10-CM

## 2011-12-25 DIAGNOSIS — F900 Attention-deficit hyperactivity disorder, predominantly inattentive type: Secondary | ICD-10-CM

## 2011-12-25 DIAGNOSIS — F909 Attention-deficit hyperactivity disorder, unspecified type: Secondary | ICD-10-CM

## 2011-12-25 DIAGNOSIS — F3131 Bipolar disorder, current episode depressed, mild: Secondary | ICD-10-CM

## 2011-12-25 DIAGNOSIS — F988 Other specified behavioral and emotional disorders with onset usually occurring in childhood and adolescence: Secondary | ICD-10-CM

## 2011-12-25 MED ORDER — OXCARBAZEPINE 600 MG PO TABS: ORAL_TABLET | ORAL | Status: DC

## 2011-12-25 MED ORDER — SITAGLIP PHOS-METFORMIN HCL ER 100-1000 MG PO TB24: 1.0000 | ORAL_TABLET | Freq: Every day | ORAL | Status: DC

## 2011-12-25 MED ORDER — METHYLPHENIDATE HCL ER (OSM) 36 MG PO TBCR: 36.0000 mg | EXTENDED_RELEASE_TABLET | Freq: Every day | ORAL | Status: DC

## 2011-12-25 MED ORDER — LAMOTRIGINE 100 MG PO TABS: 100.0000 mg | ORAL_TABLET | Freq: Two times a day (BID) | ORAL | Status: DC

## 2011-12-25 NOTE — Progress Notes (Signed)
Patient ID: Cody Barnett, male   DOB: Jul 27, 1961, 51 y.o.   MRN: [id number] Cody Barnett has come for an appointment after the long vacation holiday and is saying that he had more time than it usually needed but has had some family time and time to do at home projects. He reports that he has taken the initiative to talk with his wife and with his thoughts to explain more the range of emotions he might have and that has contributed to more understanding and I believe patient's with with the relationships he has at home and work. He is trying to be very calm and at this time it would be observed that the bipolar disorder is in partial remission. He has not had his lab tests taken so today the labs will be reordered and she is encouraged to prepare her locally to take a fasting test at [facility]. He has not expressed any thoughts of deep depression or suicidal thoughts. He has not had any extreme irritability episodes. He continues to engage in therapy with Serafina Mitchell and with his wife will see Fay Records & Serafina Mitchell.

## 2011-12-25 NOTE — Patient Instructions (Signed)
You have been given an increase in your Concerta medication and a 90 day supply of the Lamictal and Trileptal. UF also been requested to prepare for a fasting blood test on Monday the seventh by Sunday night, do not eat anything after midnight or drink anything water until you walk in for your test in the morning. Continue your therapy with Serafina Mitchell and your combined therapy however it's arranged with Fay Records.

## 2011-12-25 NOTE — Telephone Encounter (Signed)
LM for pt in regards to new medication.  MA, LPN

## 2011-12-25 NOTE — Telephone Encounter (Signed)
I changed his prescription to Janumet X R. and sent it over to his pharmacy. According to our system this should be on his plan now.

## 2011-12-25 NOTE — Telephone Encounter (Signed)
Pt would like to know if there was a generic for Kombiglyze or similar med due to the fact his insurance won't cover this med. Please advise.  MA, LPN

## 2012-01-11 ENCOUNTER — Ambulatory Visit: Payer: Self-pay | Admitting: Family Medicine

## 2012-01-11 DIAGNOSIS — Z0289 Encounter for other administrative examinations: Secondary | ICD-10-CM

## 2012-01-25 ENCOUNTER — Ambulatory Visit (INDEPENDENT_AMBULATORY_CARE_PROVIDER_SITE_OTHER): Payer: PRIVATE HEALTH INSURANCE | Admitting: Psychiatry

## 2012-01-25 ENCOUNTER — Encounter (HOSPITAL_COMMUNITY): Payer: Self-pay | Admitting: Psychiatry

## 2012-01-25 VITALS — BP 140/70 | HR 88 | Ht 70.0 in | Wt 311.0 lb

## 2012-01-25 DIAGNOSIS — F909 Attention-deficit hyperactivity disorder, unspecified type: Secondary | ICD-10-CM

## 2012-01-25 DIAGNOSIS — F3160 Bipolar disorder, current episode mixed, unspecified: Secondary | ICD-10-CM

## 2012-01-25 DIAGNOSIS — F3131 Bipolar disorder, current episode depressed, mild: Secondary | ICD-10-CM

## 2012-01-25 MED ORDER — METHYLPHENIDATE HCL ER (OSM) 27 MG PO TBCR: 27.0000 mg | EXTENDED_RELEASE_TABLET | Freq: Every day | ORAL | Status: DC

## 2012-01-25 MED ORDER — OXCARBAZEPINE 600 MG PO TABS: ORAL_TABLET | ORAL | Status: DC

## 2012-01-25 MED ORDER — LAMOTRIGINE 100 MG PO TABS: 100.0000 mg | ORAL_TABLET | Freq: Two times a day (BID) | ORAL | Status: DC

## 2012-01-25 NOTE — Patient Instructions (Addendum)
YOUR MEDICATION IS ORDERED.   You have been feeling more positive and more aware of mood.  You  Are encouraged to call 911  ED or [facility] [phone].  Make an appt with Dr. Demetrius Charity for March or April.  Continue therapy with CP

## 2012-01-25 NOTE — Progress Notes (Signed)
Patient ID: Cody Barnett, male   DOB: 18-May-1961, 51 y.o.   MRN: [id number] Cody Barnett states that he is feeling much more comfortable recognizing that he can have rapid mood swings and become irritable quickly. He has confided in his family close friends and coworkers that he recognizes this condition and is more willing to accept their feedback if he starts to get a bit irritable R. aggressive. He states that his therapy has been extremely helpful. He is concerned about his daughter's dyslexia and is trying to work with the school to improve her process of learning. He is also concerned about his wife who has difficulty balancing work responsibilities and parenting needs. She is hoping to be home more in the afternoon with the children and both she and Cody Barnett are pleased with this possibility. He does not to have any suicidal/homicidal statements and is in a very "mellow" mood today. He is taking his medications and states that at times he has a relaxing day he does not necessarily take his Concerta. He does acknowledge that in contrast, he makes sure that the children take her medications daily. He is informed about the new psychiatrist and states that he will call when he needs his Concerta prescription for March.

## 2012-03-17 ENCOUNTER — Other Ambulatory Visit (HOSPITAL_COMMUNITY): Payer: Self-pay

## 2012-03-17 ENCOUNTER — Telehealth (HOSPITAL_COMMUNITY): Payer: Self-pay | Admitting: Psychiatry

## 2012-03-17 DIAGNOSIS — F900 Attention-deficit hyperactivity disorder, predominantly inattentive type: Secondary | ICD-10-CM

## 2012-03-17 MED ORDER — METHYLPHENIDATE HCL ER (OSM) 36 MG PO TBCR: 36.0000 mg | EXTENDED_RELEASE_TABLET | Freq: Every day | ORAL | Status: DC

## 2012-03-17 NOTE — Telephone Encounter (Signed)
Spoke to patient. He reports he had been taking Concerta 36 mg. Patient denies SI/HI/AVH or medication side effects. Will fill one month Concerta 36 mg #30 No refills, to last till his next appointment.

## 2012-03-18 NOTE — Telephone Encounter (Signed)
Patient called, and was informed that he would receive refill of Concerta till his next appointment.

## 2012-03-24 ENCOUNTER — Encounter (HOSPITAL_COMMUNITY): Payer: Self-pay | Admitting: Psychiatry

## 2012-03-24 ENCOUNTER — Ambulatory Visit (INDEPENDENT_AMBULATORY_CARE_PROVIDER_SITE_OTHER): Payer: PRIVATE HEALTH INSURANCE | Admitting: Psychiatry

## 2012-03-24 VITALS — BP 111/82 | HR 75 | Ht 70.5 in | Wt 312.0 lb

## 2012-03-24 DIAGNOSIS — F3131 Bipolar disorder, current episode depressed, mild: Secondary | ICD-10-CM

## 2012-03-24 DIAGNOSIS — Q230 Congenital stenosis of aortic valve: Secondary | ICD-10-CM | POA: Insufficient documentation

## 2012-03-24 MED ORDER — OXCARBAZEPINE 600 MG PO TABS: ORAL_TABLET | ORAL | Status: DC

## 2012-03-24 MED ORDER — LAMOTRIGINE 100 MG PO TABS: 100.0000 mg | ORAL_TABLET | Freq: Two times a day (BID) | ORAL | Status: DC

## 2012-03-24 NOTE — Progress Notes (Addendum)
Psychiatric Follow Up:  Patient Identification:  Cody Barnett Date of Evaluation:  03/24/2012   HPI Comments: SUBJECTIVE: Cody Barnett is a 51 year old male with a diagnosis of Attention Deficit Hyperactivity Disorder, Hyperactive type and Bipolar II Disorder, mixed. The patient says he feels "pretty well". Cody Barnett says he reports that he feels his is doing well. He reports that his is doing well at work.  He reports he has skipped medications on weekends if he has not needed it.  The patient states he is taking all his medications and denies any side effects from his medications other than dry mouth. He denies any other complaints. He reports that his greatest stressor is his children's problems. He admits to poor compliance to management of diabetes and reports a previous HbA1c this year of over 10.  In the area of affective symptoms, patient appears euthymic. Patient denies current suicidal ideation, intent, or plan. Patient denies current homicidal ideation, intent, or plan.    Review of Systems  Constitutional: Negative for appetite change and unexpected weight change.  Respiratory: Negative for shortness of breath.   Cardiovascular: Negative for chest pain.  Gastrointestinal: Negative for constipation.  Psychiatric/Behavioral: Negative for suicidal ideas, hallucinations and decreased concentration.   Depressive Symptoms: patient denies.  (Hypo) Manic Symptoms:   Elevated Mood:  Yes Irritable Mood:  Yes Grandiosity:  Yes Distractibility:  No Lability of Mood:  Yes Delusions:  No Hallucinations:  No Impulsivity:  Yes Sexually Inappropriate Behavior:  No Financial Extravagance:  Yes Flight of Ideas:  No  Anxiety Symptoms: Excessive Worry:  Yes Panic Symptoms:  No Agoraphobia:  Yes Obsessive Compulsive: No  Symptoms: None, Specific Phobias:  No Social Anxiety:  Yes  Psychotic Symptoms:  Hallucinations: No None Delusions:  No Paranoia:  No   Ideas  of Reference:  No  PTSD Symptoms: Ever had a traumatic exposure:  No Had a traumatic exposure in the last month:  No Re-experiencing: No None Hypervigilance:  No Hyperarousal: No None Avoidance: No None  Traumatic Brain Injury: No    Physical Exam  Constitutional: He appears well-developed and well-nourished. No distress.  Skin: He is not diaphoretic.   Past Psychiatric History:  Diagnosis: Bipolar, ADHD, ADD current   Hospitalizations: None  Outpatient Care: [facility]   Substance Abuse Care: Remote   Self-Mutilation:Patient denies  Suicidal Attempts: Patient denies  Violent Behaviors: extreme anger     Past Medical History:   Past Medical History  Diagnosis Date  . Bipolar disorder   . Anxiety   . ADD (attention deficit disorder with hyperactivity)    History of Loss of Consciousness: No  Seizure History: No  Cardiac History: No  Allergies: No Known Allergies  Current Medications:    History of Loss of Consciousness:  No Seizure History:  No Cardiac History:  Yes-Aortic Stenosis Allergies:  No Known Allergies  Current Medications:  Current Outpatient Prescriptions  Medication Sig Dispense Refill  . Exenatide (BYDUREON) 2 MG SUSR Inject 2 mg into the skin once a week.  4 each  3  . lamoTRIgine (LAMICTAL) 100 MG tablet Take 1 tablet (100 mg total) by mouth 2 (two) times daily.  180 tablet  1  . lisinopril-hydrochlorothiazide (PRINZIDE,ZESTORETIC) 10-12.5 MG per tablet Take 1 tablet by mouth daily.        Marland Kitchen LOVAZA 1 G capsule       . methylphenidate (CONCERTA) 36 MG CR tablet Take 1 tablet (36 mg total) by mouth daily.  30 tablet  0  . oxcarbazepine (TRILEPTAL) 600 MG tablet TAKE 1/ 12/ 2 x DAILY  270 tablet  0  . SitaGLIPtin-MetFORMIN HCl (JANUMET XR) [phone] MG TB24 Take 1 tablet by mouth daily.  30 tablet  3    Previous Psychotropic Medications:  Medication Dose   Desipramine   unknown  Nortriptyline  unknown  Wellbutrin  unknown   Substance Abuse History  in the last 12 months:  Caffeine: . Caffeine Beverages 0-24 ounces per day. Nicotine: Patient denies.  Alcohol: Patient denies.  Illicit Drugs: Patient denies.    Blackouts:  No DT's:  No Withdrawal Symptoms:  No   Social History: Current Place of Residence:Place of Birth: [name]  Family Members: 4  Marital Status: Married  Children: 2  Sons: 1  Daughters: 1  Relationships: ex-wife and older son, never treated for ADHD  Education: unknown  Educational Problems/Performance: affected by ADHD, hyperactive, treated at [facility]  Religious Beliefs/Practices: Former Banker; [employer] History of Abuse: unknown  Pharmacist, hospital History: 6 month-medical discharge Legal History: Patient denies. Hobbies/Interests: computers  Family History:   Family History  Problem Relation Age of Onset  . ADD / ADHD Mother   . Hyperlipidemia Mother   . Hypertension Mother   . ADD / ADHD Brother   . ADD / ADHD Son   . ADD / ADHD Son   . ADD / ADHD Daughter   . Prostate cancer Father 67  . Heart attack Maternal Grandmother     Mental Status Examination/Evaluation: Objective:  Appearance: Casual  Eye Contact::  Good  Speech:  Clear and Coherent and Normal Rate  Volume:  Normal  Mood:  "pretty well"  Affect:  Appropriate  Thought Process:  Circumstantial  Orientation:  Full  Thought Content:  WDL  Suicidal Thoughts:  No  Homicidal Thoughts:  No  Judgement:  Fair  Insight:  Good  Psychomotor Activity:  Normal  Akathisia:  No  Handed:  Right  AIMS (if indicated):  Not indicated  Assets:  Communication Skills Desire for Improvement Financial Resources/Insurance Housing Social Support Talents/Skills Transportation Vocational/Educational     Assessment:   AXIS I Bipolar II disorder, most recent episode hypomanic, Attention Deficit Hyperactivity Disorder  AXIS II No diagnosis  AXIS III Aortic Stenosis, congenital  AXIS IV  economic problems, other psychosocial or environmental problems, problems related to social environment and problems with primary support group  AXIS V 51-60 moderate symptoms   Treatment Plan/Recommendations:   PLAN:  1. Affirm with the patient that the medications are taken as ordered. Patient  expressed understanding of how their medications were to be used.  2. Continue the following psychiatric medications as written prior to this appointment::  a) Trileptal 600 mg-one and a half BID b) Lamotrigine 100 mg two tablets daily C) Concerta 36 mg-if blood sugars are well controlled and patient continues to have mood lability, will consider adjustment (decrease)  of the dosage this medication to help with any mood symptoms. 3. Therapy: brief supportive therapy provided.Discussed psychosocial stressors. Discussed importance of control of diabetes. 4. Risks and benefits, side effects and alternatives discussed with patient, he was given an opportunity to ask questions about his/her medication, illness, and treatment. All current psychiatric medications have been reviewed and discussed with the patient and adjusted as clinically appropriate. The patient has been provided an accurate and updated list of the medications being now prescribed.  5. Patient told to call clinic if any problems occur.  Patient advised to go to ER  if he should develop SI/HI, side effects, or if symptoms worsen. Has crisis numbers to call if needed.   6. Will order drug screen and continue random urine drug screens as long as patient is on concerta. He reports that his last HbA1c was above  7. I have educated the importance above control of DM, and effects of poorly controlled blood sugars with mental illness. 8. The patient was encouraged to keep all PCP and specialty clinic appointments.  9. Patient was instructed to return to clinic in 1 month.  10. The patient expressed understanding of the plan outlined above and agrees  with the plan.   Jacqulyn Cane, MD 4/4/20139:49 AM

## 2012-03-25 LAB — DRUGS OF ABUSE SCREEN W/O ALC, ROUTINE URINE
Amphetamine Screen, Ur: NEGATIVE
Barbiturate Quant, Ur: NEGATIVE
Cocaine Metabolites: NEGATIVE
Marijuana Metabolite: NEGATIVE
Methadone: NEGATIVE

## 2012-04-19 ENCOUNTER — Encounter (HOSPITAL_COMMUNITY): Payer: Self-pay | Admitting: Behavioral Health

## 2012-04-25 ENCOUNTER — Encounter (HOSPITAL_COMMUNITY): Payer: Self-pay | Admitting: Psychiatry

## 2012-04-25 ENCOUNTER — Ambulatory Visit (INDEPENDENT_AMBULATORY_CARE_PROVIDER_SITE_OTHER): Payer: PRIVATE HEALTH INSURANCE | Admitting: Psychiatry

## 2012-04-25 VITALS — BP 133/82 | HR 76 | Ht 70.5 in | Wt 306.0 lb

## 2012-04-25 DIAGNOSIS — F3131 Bipolar disorder, current episode depressed, mild: Secondary | ICD-10-CM

## 2012-04-25 DIAGNOSIS — F909 Attention-deficit hyperactivity disorder, unspecified type: Secondary | ICD-10-CM

## 2012-04-25 DIAGNOSIS — F900 Attention-deficit hyperactivity disorder, predominantly inattentive type: Secondary | ICD-10-CM

## 2012-04-25 MED ORDER — OXCARBAZEPINE 600 MG PO TABS: 600.0000 mg | ORAL_TABLET | Freq: Three times a day (TID) | ORAL | Status: DC

## 2012-04-25 MED ORDER — METHYLPHENIDATE HCL ER (OSM) 36 MG PO TBCR: 36.0000 mg | EXTENDED_RELEASE_TABLET | Freq: Every day | ORAL | Status: DC

## 2012-04-25 NOTE — Progress Notes (Signed)
[facility] Follow-up Outpatient Visit  Cody Barnett 17-Jan-1961  Date:  04/25/2012    HPI Comments: SUBJECTIVE: [name] is a 51 year old male with a diagnosis of Attention Deficit Hyperactivity Disorder, Hyperactive type and Bipolar II Disorder, mixed. The patient says he feels "good". [name] says he reports that he feels his is doing well. He reports that his is doing well at work. He reports he has not skipped medications for the past few weekends. He reports he pulled his child out of public schools and sent to [employer]. The patient states he is taking all his medications and denies any side effects from his medications other than dry mouth. He denies any other complaints. He reports that his greatest stressor is his children's problems and his financial stressors. He admits to poor compliance to management of diabetes and reports a previous HbA1c this year of over 10.   In the area of affective symptoms, patient appears euthymic. Patient denies current suicidal ideation, intent, or plan. Patient denies current homicidal ideation, intent, or plan.   Review of Systems  Constitutional: Negative for appetite change and unexpected weight change.  Respiratory: Negative for shortness of breath.  Cardiovascular: Negative for chest pain.  Gastrointestinal: Negative for constipation.  Psychiatric/Behavioral: Negative for suicidal ideas, hallucinations and decreased concentration.   Depressive Symptoms: patient denies.  (Hypo) Manic Symptoms:  Elevated Mood: Yes, less frequent Irritable Mood: Yes, less frequent  Grandiosity: Yes  Distractibility: No  Lability of Mood: Yes  Delusions: No  Hallucinations: No  Impulsivity: Yes, Sexually Inappropriate Behavior: No  Financial Extravagance: Yes  Flight of Ideas: No   Anxiety Symptoms:  Excessive Worry: Yes -particularly over money Panic Symptoms: No  Agoraphobia: Yes  Obsessive Compulsive: No    Symptoms: None,  Specific Phobias: No  Social Anxiety: Yes   Psychotic Symptoms:  Hallucinations: No None  Delusions: No  Paranoia: No  Ideas of Reference: No   PTSD Symptoms:  Ever had a traumatic exposure: No  Had a traumatic exposure in the last month: No  Re-experiencing: No None  Hypervigilance: No  Hyperarousal: No None  Avoidance: No None   Traumatic Brain Injury: No   Physical Exam  Constitutional: He appears well-developed and well-nourished. No distress.  Skin: He is not diaphoretic.   Past Psychiatric History:  Diagnosis: Bipolar, ADHD, ADD current   Hospitalizations: None   Outpatient Care: [facility]   Substance Abuse Care: Remote   Self-Mutilation:Patient denies   Suicidal Attempts: Patient denies   Violent Behaviors: extreme anger    Past Medical History:  Reviewed Past Medical History   Diagnosis  Date   .  Bipolar disorder    .  Anxiety    .  ADD (attention deficit disorder with hyperactivity)     History of Loss of Consciousness: No  Seizure History: No  Cardiac History: No  Allergies: No Known Allergies  Current Medications:  History of Loss of Consciousness: No  Seizure History: No  Cardiac History: Yes-Aortic Stenosis  Allergies: No Known Allergies   Current Medications: Reviewed Current Outpatient Prescriptions on File Prior to Visit  Medication Sig Dispense Refill  . Exenatide (BYDUREON) 2 MG SUSR Inject 2 mg into the skin once a week.  4 each  3  . furosemide (LASIX) 40 MG tablet Take 40 mg by mouth daily.      Marland Kitchen lamoTRIgine (LAMICTAL) 100 MG tablet Take 1 tablet (100 mg total) by mouth 2 (two) times daily.  180 tablet  1  . lisinopril-hydrochlorothiazide (PRINZIDE,ZESTORETIC) 10-12.5 MG per tablet Take 1 tablet by mouth daily.        Marland Kitchen LOVAZA 1 G capsule       . SitaGLIPtin-MetFORMIN HCl (JANUMET XR) [phone] MG TB24 Take 1 tablet by mouth daily.  30 tablet  3  . oxcarbazepine (TRILEPTAL) 600 MG tablet TAKE 1 tablet TID  270 tablet  0   .  methylphenidate (CONCERTA) 36 MG CR tablet Take 1 tablet (36 mg total) by mouth daily.  30 tablet  0     Previous Psychotropic Medications: Reviewed Medication  Dose   Desipramine  unknown   Nortriptyline  unknown   Wellbutrin  unknown    Substance Abuse History in the last 12 months:  Reviewed Caffeine: . Caffeine Beverages 0-24 ounces per day.  Nicotine: Patient denies.  Alcohol: Patient denies.  Illicit Drugs: Patient denies.   Blackouts: No  DT's: No  Withdrawal Symptoms: No   Social History:  Reviewed Current Place of Residence:Place of Birth[name]  Family Members: 4  Marital Status: Married  Children: 2  Sons: 1  Daughters: 1  Relationships: ex-wife and older son, never treated for ADHD  Education: unknown  Educational Problems/Performance: affected by ADHD, hyperactive, treated at [facility]  Religious Beliefs/Practices: Former Banker; [employer]  History of Abuse: unknown  Pharmacist, hospital History: 6 month-medical discharge  Legal History: Patient denies.  Hobbies/Interests: computers   Family History:  Reviewed Family History   Problem  Relation  Age of Onset   .  ADD / ADHD  Mother    .  Hyperlipidemia  Mother    .  Hypertension  Mother    .  ADD / ADHD  Brother    .  ADD / ADHD  Son    .  ADD / ADHD  Son    .  ADD / ADHD  Daughter    .  Prostate cancer  Father  55   .  Heart attack  Maternal Grandmother     Filed Vitals:   04/25/12 0956  BP: 133/82  Pulse: 76  Height: 5' 10.5" (1.791 m)  Weight: 306 lb (138.801 kg)    Mental Status Examination/Evaluation:  Objective: Appearance: Casual   Eye Contact:: Good   Speech: Clear and Coherent and Normal Rate   Volume: Normal   Mood: "good"   Affect: Appropriate   Thought Process: Circumstantial   Orientation: Full   Thought Content: WDL   Suicidal Thoughts: No   Homicidal Thoughts: No   Judgement: Fair   Insight: Good   Psychomotor Activity:  Normal   Akathisia: No   Handed: Right   AIMS (if indicated): Not indicated   Memory: Immediate 3/3, recent 1/3  Assets: Communication Skills  Desire for Improvement  Financial Resources/Insurance  Housing  Social Support  Talents/Skills  Transportation  Vocational/Educational    Assessment:  AXIS I  Bipolar II disorder, most recent episode hypomanic, Attention Deficit Hyperactivity Disorder   AXIS II  No diagnosis   AXIS III  Aortic Stenosis, congenital   AXIS IV  economic problems, other psychosocial or environmental problems, problems related to social environment and problems with primary support group   AXIS V  51-60 moderate symptoms    Treatment Plan/Recommendations:  PLAN:  1. Affirm with the patient that the medications are taken as ordered. Patient  expressed understanding of how their medications were to be used.  2. Continue the following  psychiatric medications as written prior to this appointment::  a) Trileptal 600 mg-one tablet three times daily. Patient will sometimes take one and a half tablets BID. b) Lamotrigine 100 mg two tablets daily  c) Concerta 36 mg-if blood sugars are well controlled and patient continues to have mood lability, will consider adjustment (decrease) of the dosage this medication to help with any mood symptoms.   3. Therapy: brief supportive therapy provided.Discussed psychosocial stressors. Discussed importance of control of diabetes.  4. Risks and benefits, side effects and alternatives discussed with patient, he was given an opportunity to ask questions about his/her medication, illness, and treatment. All current psychiatric medications have been reviewed and discussed with the patient and adjusted as clinically appropriate. The patient has been provided an accurate and updated list of the medications being now prescribed.  5. Patient told to call clinic if any problems occur. Patient advised to go to ER if he should develop SI/HI, side  effects, or if symptoms worsen. Has crisis numbers to call if needed.  6. Will order drug screen and continue random urine drug screens as long as patient is on concerta.  7. I have educated the importance above control of DM, and effects of poorly controlled blood sugars with mental illness.  8. The patient was encouraged to keep all PCP and specialty clinic appointments.  9. Patient was instructed to return to clinic in 1 month.  10. The patient expressed understanding of the plan outlined above and agrees with the plan.   Jacqulyn Cane, MD

## 2012-05-18 ENCOUNTER — Other Ambulatory Visit: Payer: Self-pay | Admitting: Family Medicine

## 2012-05-30 ENCOUNTER — Other Ambulatory Visit: Payer: Self-pay | Admitting: Family Medicine

## 2012-06-16 ENCOUNTER — Other Ambulatory Visit (HOSPITAL_COMMUNITY): Payer: Self-pay

## 2012-06-16 NOTE — Telephone Encounter (Signed)
Called patient and asked patient to come in to clinic tomorrow for prescription.

## 2012-06-17 ENCOUNTER — Other Ambulatory Visit (HOSPITAL_COMMUNITY): Payer: Self-pay

## 2012-06-17 ENCOUNTER — Other Ambulatory Visit: Payer: Self-pay | Admitting: Family Medicine

## 2012-06-17 DIAGNOSIS — F900 Attention-deficit hyperactivity disorder, predominantly inattentive type: Secondary | ICD-10-CM

## 2012-06-17 MED ORDER — METHYLPHENIDATE HCL ER (OSM) 36 MG PO TBCR: 36.0000 mg | EXTENDED_RELEASE_TABLET | Freq: Every day | ORAL | Status: DC

## 2012-06-17 NOTE — Telephone Encounter (Signed)
Pt needs f/u appt before future refills 

## 2012-06-17 NOTE — Telephone Encounter (Signed)
Will refill medications as follows.  PLAN: 1. Methylphenidate 36 mg CR tablet on tablet daily.

## 2012-06-27 ENCOUNTER — Ambulatory Visit (HOSPITAL_COMMUNITY): Payer: Self-pay | Admitting: Psychiatry

## 2012-08-03 ENCOUNTER — Other Ambulatory Visit (HOSPITAL_COMMUNITY): Payer: Self-pay

## 2012-08-03 ENCOUNTER — Other Ambulatory Visit (HOSPITAL_COMMUNITY): Payer: Self-pay | Admitting: Psychiatry

## 2012-08-03 NOTE — Telephone Encounter (Signed)
Spoke with staff. Will have patient schedule an appointment as I have not seen the patient in 2 months.

## 2012-08-05 ENCOUNTER — Telehealth (HOSPITAL_COMMUNITY): Payer: Self-pay | Admitting: Psychiatry

## 2012-08-05 DIAGNOSIS — F3131 Bipolar disorder, current episode depressed, mild: Secondary | ICD-10-CM

## 2012-08-05 MED ORDER — OXCARBAZEPINE 600 MG PO TABS: 600.0000 mg | ORAL_TABLET | Freq: Three times a day (TID) | ORAL | Status: DC

## 2012-08-05 NOTE — Telephone Encounter (Signed)
Called in prescription to pharmacy. I have told the pharmacy to have patient call for an appointment. Will only fill a 30 day supply at this time.

## 2012-08-08 ENCOUNTER — Other Ambulatory Visit (HOSPITAL_COMMUNITY): Payer: Self-pay | Admitting: Psychiatry

## 2012-08-08 ENCOUNTER — Other Ambulatory Visit: Payer: Self-pay | Admitting: Family Medicine

## 2012-08-08 MED ORDER — METHYLPHENIDATE HCL ER (OSM) 36 MG PO TBCR: 36.0000 mg | EXTENDED_RELEASE_TABLET | ORAL | Status: DC

## 2012-08-24 ENCOUNTER — Other Ambulatory Visit: Payer: Self-pay | Admitting: Family Medicine

## 2012-08-30 ENCOUNTER — Encounter (HOSPITAL_COMMUNITY): Payer: Self-pay | Admitting: Psychiatry

## 2012-08-30 ENCOUNTER — Ambulatory Visit (INDEPENDENT_AMBULATORY_CARE_PROVIDER_SITE_OTHER): Payer: PRIVATE HEALTH INSURANCE | Admitting: Psychiatry

## 2012-08-30 VITALS — BP 127/81 | HR 68 | Ht 70.5 in | Wt 306.0 lb

## 2012-08-30 DIAGNOSIS — F3181 Bipolar II disorder: Secondary | ICD-10-CM

## 2012-08-30 DIAGNOSIS — F3131 Bipolar disorder, current episode depressed, mild: Secondary | ICD-10-CM

## 2012-08-30 DIAGNOSIS — F3189 Other bipolar disorder: Secondary | ICD-10-CM

## 2012-08-30 DIAGNOSIS — F909 Attention-deficit hyperactivity disorder, unspecified type: Secondary | ICD-10-CM

## 2012-08-30 MED ORDER — LAMOTRIGINE 100 MG PO TABS: 100.0000 mg | ORAL_TABLET | Freq: Two times a day (BID) | ORAL | Status: DC

## 2012-08-30 MED ORDER — OXCARBAZEPINE 600 MG PO TABS: 600.0000 mg | ORAL_TABLET | Freq: Three times a day (TID) | ORAL | Status: DC

## 2012-08-30 MED ORDER — METHYLPHENIDATE HCL ER (OSM) 36 MG PO TBCR: 36.0000 mg | EXTENDED_RELEASE_TABLET | ORAL | Status: DC

## 2012-08-30 NOTE — Progress Notes (Signed)
[facility] Follow-up Outpatient Visit  Cody Barnett [birth year]  Date: 08/30/2012  HPI Comments: SUBJECTIVE: [name] is a 51 year old male with a diagnosis of Attention Deficit Hyperactivity Disorder, Hyperactive type and Bipolar II Disorder, mixed.   The patient reports that his main stressors are: putting his father in hospice; dealing with his children. He does report that he is doing well at work with concerta. He reports some agitation, particularly when he drinks caffeinated  beverages.   In the area of affective symptoms, patient appears euthymic. Patient denies current suicidal ideation, intent, or plan. Patient denies current homicidal ideation, intent, or plan. Patient denies auditory hallucinations. Patient denies visual hallucinations. Patient denies symptoms of paranoia. Patient states sleep is good, with approximately 7 hours of sleep per night. Appetite is fair. Energy level is good. Patient endorses/denies symptoms of anhedonia. Patient endorses/denies hopelessness, helplessness, or guilt.   Denies any recent episodes consistent with mania, particularly decreased need for sleep with increased energy, grandiosity, impulsivity, hyperverbal and pressured speech, or increased productivity. Denies any recent symptoms consistent with psychosis, particularly auditory or visual hallucinations, thought broadcasting/insertion/withdrawal, or ideas of reference. Also denies excessive worry to the point of physical symptoms as well as any panic attacks. Denies any history of trauma or symptoms consistent with PTSD such as flashbacks, nightmares, hypervigilance, feelings of numbness or inability to connect with others.    Review of Systems  Constitutional: Negative for appetite change and unexpected weight change.  Respiratory: Negative for shortness of breath.  Cardiovascular: Negative for chest pain.  Gastrointestinal: Negative for constipation.    Psychiatric/Behavioral: Negative for suicidal ideas, hallucinations and decreased concentration.   Physical Exam  Constitutional: He appears well-developed and well-nourished. No distress.  Skin: He is not diaphoretic.   Past Psychiatric History:  Diagnosis: Bipolar, ADHD, ADD current   Hospitalizations: None   Outpatient Care: [facility]   Substance Abuse Care: Remote   Self-Mutilation:Patient denies   Suicidal Attempts: Patient denies   Violent Behaviors: extreme anger    Past Medical History: Reviewed  Past Medical History   Diagnosis  Date   .  Bipolar disorder    .  Anxiety    .  ADD (attention deficit disorder with hyperactivity)     History of Loss of Consciousness: No  Seizure History: No  Cardiac History: No  Allergies: No Known Allergies  Current Medications:  History of Loss of Consciousness: No  Seizure History: No  Cardiac History: Yes-Aortic Stenosis  Allergies: No Known Allergies   Current Medications: Reviewed Current Outpatient Prescriptions on File Prior to Visit  Medication Sig Dispense Refill  . BYDUREON 2 MG SUSR INJECT INTO SKIN ONCE A WEEK AS DIRECTED  4 each  2  . furosemide (LASIX) 20 MG tablet TAKE 1 TABLET BY MOUTH ONCE A DAY  30 tablet  0  . JANUMET XR [phone] MG TB24 TAKE 1 TABLET BY MOUTH DAILY.  30 tablet  0  . lamoTRIgine (LAMICTAL) 100 MG tablet Take 1 tablet (100 mg total) by mouth 2 (two) times daily.  180 tablet  1  . lisinopril-hydrochlorothiazide (PRINZIDE,ZESTORETIC) 10-12.5 MG per tablet Take 1 tablet by mouth daily.        Marland Kitchen LOVAZA 1 G capsule TAKE 1 CAPSULE FOUR TIMES A DAY  360 capsule  2  . methylphenidate (CONCERTA) 36 MG CR tablet Take 1 tablet (36 mg total) by mouth every morning.  30 tablet  0  . oxcarbazepine (TRILEPTAL) 600 MG  tablet Take 1 tablet (600 mg total) by mouth 3 (three) times daily.  90 tablet  0  . furosemide (LASIX) 40 MG tablet Take 40 mg by mouth daily.      Marland Kitchen lisinopril-hydrochlorothiazide  (PRINZIDE,ZESTORETIC) 20-25 MG per tablet TAKE 1 TABLET EVERY DAY  30 tablet  0  . methylphenidate (CONCERTA) 36 MG CR tablet Take 1 tablet (36 mg total) by mouth daily.  30 tablet  0     Previous Psychotropic Medications: Reviewed  Medication  Dose   Desipramine  unknown   Nortriptyline  unknown   Wellbutrin  unknown    Substance Abuse History in the last 12 months: Reviewed  Caffeine: . Caffeine Beverages 0-24 ounces per day.  Nicotine: Patient denies.  Alcohol: Patient denies.  Illicit Drugs: Patient denies.   Blackouts: No  DT's: No  Withdrawal Symptoms: No   Social History: Reviewed  Current Place of Residence:Place of Birth: [name]  Family Members: 4  Marital Status: Married  Children: 2  Sons: 1  Daughters: 1  Relationships: ex-wife and older son, never treated for ADHD  Education: unknown  Educational Problems/Performance: affected by ADHD, hyperactive, treated at [facility]  Religious Beliefs/Practices: Former Banker; [employer]  History of Abuse: unknown  Pharmacist, hospital History: 6 month-medical discharge  Legal History: Patient denies.  Hobbies/Interests: computers   Family History: Reviewed  Family History   Problem  Relation  Age of Onset   .  ADD / ADHD  Mother    .  Hyperlipidemia  Mother    .  Hypertension  Mother    .  ADD / ADHD  Brother    .  ADD / ADHD  Son    .  ADD / ADHD  Son    .  ADD / ADHD  Daughter    .  Prostate cancer  Father  100   .  Heart attack  Maternal Grandmother     Filed Vitals:   08/30/12 1022  BP: 127/81  Pulse: 68  Height: 5' 10.5" (1.791 m)  Weight: 306 lb (138.801 kg)    Mental Status Examination/Evaluation:  Objective: Appearance: Casual   Eye Contact:: Good   Speech: Clear and Coherent and Normal Rate   Volume: Normal   Mood: "good"   Affect: Appropriate   Thought Process: Circumstantial   Orientation: Full   Thought Content: WDL   Suicidal Thoughts: No     Homicidal Thoughts: No   Judgement: Fair   Insight: Good   Psychomotor Activity: Normal   Akathisia: No   Handed: Right   AIMS (if indicated): Not indicated   Memory: Immediate 3/3, recent 2/3   Assets: Communication Skills  Desire for Improvement  Financial Resources/Insurance  Housing  Social Support  Talents/Skills  Transportation  Vocational/Educational    Assessment:  AXIS I  Bipolar II disorder, most recent episode hypomanic, Attention Deficit Hyperactivity Disorder   AXIS II  No diagnosis   AXIS III  Aortic Stenosis, congenital   AXIS IV  economic problems, other psychosocial or environmental problems, problems related to social environment and problems with primary support group   AXIS V  51-60 moderate symptoms    Treatment Plan/Recommendations:   1. Affirm with the patient that the medications are taken as ordered. Patient  expressed understanding of how their medications were to be used.  2. Continue the following psychiatric medications as written prior to this appointment::  a) Trileptal 600 mg-one tablet three times daily.  b) Lamotrigine 100 mg two tablets daily  c) Concerta 36 mg 3. Therapy: brief supportive therapy provided.Discussed psychosocial stressors. Discussed importance of control of diabetes.  4. Risks and benefits, side effects and alternatives discussed with patient, he was given an opportunity to ask questions about his/her medication, illness, and treatment. All current psychiatric medications have been reviewed and discussed with the patient and adjusted as clinically appropriate. The patient has been provided an accurate and updated list of the medications being now prescribed.  5. Patient told to call clinic if any problems occur. Patient advised to go to ER if he should develop SI/HI, side effects, or if symptoms worsen. Has crisis numbers to call if needed.  6. Will order drug screen and continue random urine drug screens as long as patient is on  concerta.  Recommend EKG at next PCP appointment. 7. I have educated the importance above control of DM, and effects of poorly controlled blood sugars with mental illness.  8. The patient was encouraged to keep all PCP and specialty clinic appointments.  9. Patient was instructed to return to clinic in 1 month.  10. The patient expressed understanding of the plan outlined above and agrees with the plan.     Jacqulyn Cane, MD

## 2012-08-31 LAB — DRUGS OF ABUSE SCREEN W/O ALC, ROUTINE URINE
Amphetamine Screen, Ur: NEGATIVE
Benzodiazepines.: NEGATIVE
Cocaine Metabolites: NEGATIVE
Creatinine,U: 83.9 mg/dL

## 2012-09-08 ENCOUNTER — Ambulatory Visit (INDEPENDENT_AMBULATORY_CARE_PROVIDER_SITE_OTHER): Payer: PRIVATE HEALTH INSURANCE | Admitting: Family Medicine

## 2012-09-08 ENCOUNTER — Telehealth: Payer: Self-pay | Admitting: *Deleted

## 2012-09-08 ENCOUNTER — Encounter: Payer: Self-pay | Admitting: Family Medicine

## 2012-09-08 VITALS — BP 141/75 | HR 76 | Wt 308.0 lb

## 2012-09-08 DIAGNOSIS — I10 Essential (primary) hypertension: Secondary | ICD-10-CM

## 2012-09-08 DIAGNOSIS — Z6841 Body Mass Index (BMI) 40.0 and over, adult: Secondary | ICD-10-CM

## 2012-09-08 DIAGNOSIS — E119 Type 2 diabetes mellitus without complications: Secondary | ICD-10-CM

## 2012-09-08 DIAGNOSIS — F3189 Other bipolar disorder: Secondary | ICD-10-CM

## 2012-09-08 DIAGNOSIS — F3181 Bipolar II disorder: Secondary | ICD-10-CM

## 2012-09-08 LAB — POCT GLYCOSYLATED HEMOGLOBIN (HGB A1C): Hemoglobin A1C: 8.9

## 2012-09-08 MED ORDER — LISINOPRIL 40 MG PO TABS: 40.0000 mg | ORAL_TABLET | Freq: Every day | ORAL | Status: DC

## 2012-09-08 NOTE — Patient Instructions (Addendum)
continue to work on diet and exercise Follow up in 3 months for diabetes and blood pressure.

## 2012-09-08 NOTE — Progress Notes (Signed)
  Subjective:    Patient ID: Cody Barnett, male    DOB: Mar 02, 1961, 51 y.o.   MRN: [id number]  HPI DM- Says has missed some of his shtos. Not checking sugars consistantly. Sugars mostly in 200s.  Admits he has not been eating well. Has lost 5 lbs since December.  Was walking for exercise but father recently passed away.    Hypertension-no chest pain or short of breath. He says he is taking his medication. He says normally when he checks his blood pressure home it runs in the 120s. Is not sure why it's elevated today.  He does need an EKG because he is on Concerta. His psychiatrist requests that we do so. He denies any recent chest pain or shortness of breath. No prior history of cardiac disease. Review of Systems     Objective:   Physical Exam  Constitutional: He is oriented to person, place, and time. He appears well-developed and well-nourished.       + obese.   HENT:  Head: Normocephalic and atraumatic.  Cardiovascular: Normal rate, regular rhythm and normal heart sounds.   Pulmonary/Chest: Effort normal and breath sounds normal.  Neurological: He is alert and oriented to person, place, and time.  Skin: Skin is warm and dry.  Psychiatric: He has a normal mood and affect. His behavior is normal.          Assessment & Plan:  DM- Uncontrolled. A1C is better than in December, so some progress. We will continue work on diet and exercise as he admits he really does have room for improvement if he were really work on this. He says he plans on joining a gym. Also be more consistent with a vibratory on injection. Followup in 3 months. Exam performed today.  Hypertension-not well controlled today. Continue current regimen continue work on exercise and diet and recheck in 3 months. If still not at goal at that point we will need to adjust his medication.  He also request an EKG because he's on Concerta stimulant. His EKG shows rate of 70 beats per minute, normal sinus rhythm with  normal axis. No acute hanges or ST-T wave changes.  Bipolar - it questions about his medications. I encouraged him to discuss this with Dr. Baron Sane.  Lamictal was added to his regimen a couple years ago. He wonders if he still needs it. I encouraged him to speak with his psychiatrist about that and whether not it could be eventually weaned off, with a continuation of his Trileptal

## 2012-09-13 ENCOUNTER — Telehealth (HOSPITAL_COMMUNITY): Payer: Self-pay

## 2012-09-13 ENCOUNTER — Other Ambulatory Visit: Payer: Self-pay | Admitting: Family Medicine

## 2012-09-13 DIAGNOSIS — F909 Attention-deficit hyperactivity disorder, unspecified type: Secondary | ICD-10-CM

## 2012-09-13 DIAGNOSIS — F3131 Bipolar disorder, current episode depressed, mild: Secondary | ICD-10-CM

## 2012-09-13 LAB — COMPLETE METABOLIC PANEL WITH GFR
Albumin: 3.9 g/dL (ref 3.5–5.2)
BUN: 14 mg/dL (ref 6–23)
CO2: 29 mEq/L (ref 19–32)
GFR, Est African American: >89 mL/min
GFR, Est Non African American: 80 mL/min
Glucose, Bld: 162 mg/dL — ABNORMAL HIGH (ref 70–99)
Potassium: 4.9 mEq/L (ref 3.5–5.3)
Sodium: 139 mEq/L (ref 135–145)
Total Protein: 6.5 g/dL (ref 6.0–8.3)

## 2012-09-13 LAB — LIPID PANEL: Cholesterol: 207 mg/dL — ABNORMAL HIGH (ref 0–200)

## 2012-09-13 MED ORDER — OXCARBAZEPINE 600 MG PO TABS: 600.0000 mg | ORAL_TABLET | Freq: Three times a day (TID) | ORAL | Status: DC

## 2012-09-13 MED ORDER — METHYLPHENIDATE HCL ER (OSM) 36 MG PO TBCR: 36.0000 mg | EXTENDED_RELEASE_TABLET | ORAL | Status: DC

## 2012-09-13 NOTE — Telephone Encounter (Signed)
Patient has not filled Concerta, will provide another prescription. Will e-scribe a new prescription for Trileptal also.

## 2012-09-14 ENCOUNTER — Telehealth: Payer: Self-pay | Admitting: *Deleted

## 2012-09-14 DIAGNOSIS — E785 Hyperlipidemia, unspecified: Secondary | ICD-10-CM

## 2012-09-15 ENCOUNTER — Other Ambulatory Visit: Payer: Self-pay | Admitting: *Deleted

## 2012-09-15 MED ORDER — SITAGLIP PHOS-METFORMIN HCL ER 100-1000 MG PO TB24: 1.0000 | ORAL_TABLET | Freq: Every day | ORAL | Status: DC

## 2012-09-15 MED ORDER — FUROSEMIDE 20 MG PO TABS: 20.0000 mg | ORAL_TABLET | Freq: Every day | ORAL | Status: DC

## 2012-10-22 ENCOUNTER — Other Ambulatory Visit: Payer: Self-pay | Admitting: Family Medicine

## 2012-10-24 ENCOUNTER — Other Ambulatory Visit (HOSPITAL_COMMUNITY): Payer: Self-pay

## 2012-10-24 DIAGNOSIS — F909 Attention-deficit hyperactivity disorder, unspecified type: Secondary | ICD-10-CM

## 2012-10-25 MED ORDER — METHYLPHENIDATE HCL ER (OSM) 36 MG PO TBCR: 36.0000 mg | EXTENDED_RELEASE_TABLET | ORAL | Status: DC

## 2012-10-25 NOTE — Telephone Encounter (Signed)
Patient last filled Concerta on 09/13/2012,per pharmacy,  and therefore this is not an early refill. Advised patient not to interchange medications.  PLAN: Will fill Concerta 36 mg #30, no refills.

## 2012-10-31 ENCOUNTER — Encounter (HOSPITAL_COMMUNITY): Payer: Self-pay | Admitting: Psychiatry

## 2012-10-31 ENCOUNTER — Ambulatory Visit (INDEPENDENT_AMBULATORY_CARE_PROVIDER_SITE_OTHER): Payer: PRIVATE HEALTH INSURANCE | Admitting: Psychiatry

## 2012-10-31 VITALS — BP 132/100 | HR 93 | Ht 70.5 in | Wt 309.0 lb

## 2012-10-31 DIAGNOSIS — F909 Attention-deficit hyperactivity disorder, unspecified type: Secondary | ICD-10-CM

## 2012-10-31 DIAGNOSIS — F3181 Bipolar II disorder: Secondary | ICD-10-CM

## 2012-10-31 DIAGNOSIS — F3189 Other bipolar disorder: Secondary | ICD-10-CM

## 2012-10-31 MED ORDER — LITHIUM CARBONATE 150 MG PO CAPS: ORAL_CAPSULE | ORAL | Status: DC

## 2012-10-31 NOTE — Progress Notes (Signed)
[facility] Follow-up Outpatient Visit  Cody Barnett 1961-04-16  Date: 10/31/2012 HPI Comments: SUBJECTIVE: [name] is a 51 year old male with a diagnosis of Attention Deficit Hyperactivity Disorder, Hyperactive type and Bipolar II Disorder, mixed.   The patient reports his most recent stressor has been the death of his father in 11-12-12.  The patient was present at his father's passing as was his mother.  The patient continues to grieve over his father death and is currently in the depressive phase entering the acceptance phase.  He states his mood have been "all over the place."  He reports that he regrets that his children were not able to know his father, personally.  He states that his mood mainly swing from irritable to depressed.  He continues to have a difficulty with his schedule and taking his children to school and is not sleeping.  He states therapy has not worked together because he "tends to be manipulative and needs to be pushed harder...but mainly financial." He feels his oxcarbazepine is not controlling his moods and would like to change his medications.   In the area of affective symptoms, patient appears euthymic. Patient denies current suicidal ideation, intent, or plan. Patient denies current homicidal ideation, intent, or plan. Patient denies auditory hallucinations. Patient denies visual hallucinations. Patient denies symptoms of paranoia. Patient states sleep is good, with approximately 4-5 hours of sleep per night. Appetite is fair. Energy level is fair to low. Patient endorses symptoms of anhedonia. Patient denies hopelessness, helplessness, or guilt.   He reports recent episodes consistent with mania, particularly decreased need for sleep with increased energy, grandiosity, impulsivity, hyperverbal and pressured speech, and increased productivity. Denies any recent symptoms consistent with psychosis, particularly auditory or visual  hallucinations, thought broadcasting/insertion/withdrawal, or ideas of reference. Also denies excessive worry to the point of physical symptoms as well as any panic attacks. Denies any history of trauma or symptoms consistent with PTSD such as flashbacks, nightmares, hypervigilance, feelings of numbness or inability to connect with others.   Review of Systems  Constitutional: Negative for appetite change and unexpected weight change.  Respiratory: Negative for shortness of breath.  Cardiovascular: Negative for chest pain.  Gastrointestinal: Negative for constipation.  Psychiatric/Behavioral: Negative for suicidal ideas, hallucinations and decreased concentration.   Filed Vitals:   10/31/12 1006  BP: 132/100  Pulse: 93  Height: 5' 10.5" (1.791 m)  Weight: 309 lb (140.161 kg)   Physical Exam  Constitutional: He appears well-developed and well-nourished. No distress.  Skin: He is not diaphoretic.   Past Psychiatric History:  Reviewed Diagnosis: Bipolar, ADHD, ADD current   Hospitalizations: None   Outpatient Care: [facility]   Substance Abuse Care: Remote   Self-Mutilation:Patient denies   Suicidal Attempts: Patient denies   Violent Behaviors: extreme anger    Past Medical History: Reviewed  Past Medical History  Diagnosis Date  . Hypertension     History of Loss of Consciousness: No  Seizure History: No  Cardiac History: No   Allergies: No Known Allergies   Current Medications:  History of Loss of Consciousness: No  Seizure History: No  Cardiac History: Yes-Aortic Stenosis   Allergies: No Known Allergies   Current Medications: Reviewed Current Outpatient Prescriptions on File Prior to Visit  Medication Sig Dispense Refill  . BYDUREON 2 MG SUSR INJECT INTO SKIN ONCE A WEEK AS DIRECTED  4 each  2  . furosemide (LASIX) 20 MG tablet Take 1 tablet (20 mg total)  by mouth daily.  90 tablet  0  . lamoTRIgine (LAMICTAL) 100 MG tablet Take 1 tablet (100 mg total) by mouth 2  (two) times daily.  180 tablet  1  . lisinopril (PRINIVIL,ZESTRIL) 40 MG tablet Take 1 tablet (40 mg total) by mouth daily.  90 tablet  1  . LOVAZA 1 G capsule TAKE 1 CAPSULE FOUR TIMES A DAY  360 capsule  2  . methylphenidate (CONCERTA) 36 MG CR tablet Take 1 tablet (36 mg total) by mouth every morning. Fill on or after 10/25/2012.  30 tablet  0  . oxcarbazepine (TRILEPTAL) 600 MG tablet Take 1 tablet (600 mg total) by mouth 3 (three) times daily.  270 tablet  0  . SitaGLIPtin-MetFORMIN HCl (JANUMET XR) [phone] MG TB24 Take 1 tablet by mouth daily.  90 tablet  0   Previous Psychotropic Medications: Reviewed  Medication  Dose   Desipramine  unknown   Nortriptyline  unknown   Wellbutrin  unknown    Substance Abuse History in the last 12 months: Reviewed  Caffeine: . Caffeine Beverages 0-24 ounces per day.  Nicotine: Patient denies.  Alcohol: Patient denies.  Illicit Drugs: Patient denies.   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  Hobbies/Interests: computers   Family History: Reviewed  Family History  Problem Relation Age of Onset  . ADD / ADHD Mother   . Hyperlipidemia Mother   . Hypertension Mother   . Dementia Mother   . ADD / ADHD Brother   . ADD / ADHD Son   . ADD / ADHD Son   . ADD / ADHD Daughter   . Prostate cancer Father 57  . Alzheimer's disease Father   . Colon cancer Father   . Stomach cancer Father    . Heart attack Maternal Grandmother      Mental Status Examination/Evaluation:  Objective: Appearance: Casual   Eye Contact:: Good   Speech: Clear and Coherent and Normal Rate   Volume: Normal   Mood: "good right now"   Affect: Appropriate   Thought Process: Circumstantial   Orientation: Full   Thought Content: WDL   Suicidal Thoughts: No   Homicidal Thoughts: No   Judgement: Fair   Insight: Good   Psychomotor Activity: Normal   Akathisia: No   Handed: Right   AIMS (if indicated): Not indicated   Memory: Immediate 3/3, recent 2/3   Assets: Communication Skills  Desire for Improvement  Financial Resources/Insurance  Housing  Social Support  Talents/Skills  Transportation  Vocational/Educational    Assessment:  AXIS I  Bipolar II disorder, most recent episode hypomanic, Attention Deficit Hyperactivity Disorder   AXIS II  No diagnosis   AXIS III  Aortic Stenosis, congenital   AXIS IV  economic problems, other psychosocial or environmental problems, problems related to social environment and problems with primary support group   AXIS V  51-60 moderate symptoms    Treatment Plan/Recommendations:  1. Affirm with the patient that the medications are taken as ordered. Patient  expressed understanding of how their medications were to be used.  2. Continue the following psychiatric medications as written prior to this appointment::  a) Trileptal 600 mg-one tablet three times daily. Will taper and stop this medication, if possible. b) Lamotrigine 100 mg two tablets daily  c) Concerta 36 mg  D) Start Lithium 150 mg for 2 weeks, then increase to 300 mg daily. 3. Therapy: brief supportive therapy provided. Discussed psychosocial stressors. Discussed importance of control of diabetes.  4. Risks and benefits, side effects and alternatives discussed with patient, he was given an opportunity to ask questions about his/her medication, illness, and treatment. All current psychiatric  medications have been reviewed and discussed with the patient and adjusted as clinically appropriate. The patient has been provided an accurate and updated list of the medications being now prescribed.  5. Patient told to call clinic if any problems occur. Patient advised to go to ER if he should develop SI/HI, side effects, or if symptoms worsen. Has crisis numbers to call if needed.  6. Will order drug screen and continue random urine drug screens as long as patient is on concerta.  7. Will order BUN/Creatinine, Lithium, trileptal and lamictal levels, 8. The patient was encouraged to keep all PCP and specialty clinic appointments.  9. Patient was instructed to return to clinic in 1 month.  10. The patient expressed understanding of the plan outlined above and agrees with the plan.    Jacqulyn Cane, MD

## 2012-11-06 ENCOUNTER — Other Ambulatory Visit: Payer: Self-pay | Admitting: Family Medicine

## 2012-11-25 LAB — CREATININE, SERUM: Creat: 1.05 mg/dL (ref 0.50–1.35)

## 2012-11-25 LAB — LITHIUM LEVEL: Lithium Lvl: 0.1 mEq/L — ABNORMAL LOW (ref 0.80–1.40)

## 2012-11-30 ENCOUNTER — Encounter (HOSPITAL_COMMUNITY): Payer: Self-pay | Admitting: Psychiatry

## 2012-11-30 ENCOUNTER — Ambulatory Visit (INDEPENDENT_AMBULATORY_CARE_PROVIDER_SITE_OTHER): Payer: PRIVATE HEALTH INSURANCE | Admitting: Psychiatry

## 2012-11-30 VITALS — BP 125/76 | HR 84 | Ht 70.5 in | Wt 305.5 lb

## 2012-11-30 DIAGNOSIS — F909 Attention-deficit hyperactivity disorder, unspecified type: Secondary | ICD-10-CM

## 2012-11-30 DIAGNOSIS — F3181 Bipolar II disorder: Secondary | ICD-10-CM

## 2012-11-30 DIAGNOSIS — F3189 Other bipolar disorder: Secondary | ICD-10-CM

## 2012-11-30 DIAGNOSIS — F3131 Bipolar disorder, current episode depressed, mild: Secondary | ICD-10-CM

## 2012-11-30 MED ORDER — METHYLPHENIDATE HCL ER (OSM) 36 MG PO TBCR: 36.0000 mg | EXTENDED_RELEASE_TABLET | ORAL | Status: DC

## 2012-11-30 MED ORDER — LITHIUM CARBONATE 150 MG PO CAPS: ORAL_CAPSULE | ORAL | Status: DC

## 2012-11-30 MED ORDER — OXCARBAZEPINE 600 MG PO TABS: 600.0000 mg | ORAL_TABLET | Freq: Three times a day (TID) | ORAL | Status: DC

## 2012-11-30 NOTE — Progress Notes (Signed)
[facility] Follow-up Outpatient Visit  Cody Barnett 27-Dec-1960  Date: 11/30/2012  HPI Comments: SUBJECTIVE: [name] is a 51 year old male with a diagnosis of Attention Deficit Hyperactivity Disorder, Hyperactive type and Bipolar II Disorder, mixed.   The patient endorses continued verbal impulsiveness. He endorses "cutting comments" towards his family members.  He states he continues to exercise. He states that the holidays will be difficult without his father who recently passed away.  The patient denies any physical outbursts. He states he is taking his medications, with 300 mg of Lithium.   In the area of affective symptoms, patient appears euthymic. Patient denies current suicidal ideation, intent, or plan. Patient denies current homicidal ideation, intent, or plan. Patient denies auditory hallucinations. Patient denies visual hallucinations. Patient denies symptoms of paranoia. Patient states sleep is good, with approximately 5-7 hours of sleep per night.  Appetite is fair. Energy level is fair. Patient denies symptoms of anhedonia. Patient denies hopelessness, helplessness, or guilt.   He reports recent episodes consistent with mania, particularly decreased need for sleep with increased energy, grandiosity, impulsivity, hyperverbal and pressured speech, and increased productivity. Denies any recent symptoms consistent with psychosis, particularly auditory or visual hallucinations, thought broadcasting/insertion/withdrawal, or ideas of reference. Also denies excessive worry to the point of physical symptoms as well as any panic attacks. Denies any history of trauma or symptoms consistent with PTSD such as flashbacks, nightmares, hypervigilance, feelings of numbness or inability to connect with others.   Review of Systems  Constitutional: Negative for appetite change and unexpected weight change.  Respiratory: Negative for shortness of breath.   Cardiovascular: Negative for chest pain.  Gastrointestinal: Negative for constipation.  Psychiatric/Behavioral: Negative for suicidal ideas, hallucinations and decreased concentration.   Filed Vitals:   11/30/12 1027  BP: 125/76  Pulse: 84  Height: 5' 10.5" (1.791 m)  Weight: 305 lb 8 oz (138.574 kg)   Physical Exam  Constitutional: He appears well-developed and well-nourished. No distress.  Skin: He is not diaphoretic.   Past Psychiatric History:  Reviewed Diagnosis: Bipolar, ADHD, ADD current   Hospitalizations: None   Outpatient Care: [facility]   Substance Abuse Care: Remote   Self-Mutilation:Patient denies   Suicidal Attempts: Patient denies   Violent Behaviors: extreme anger    Past Medical History: Reviewed  Past Medical History  Diagnosis Date  . Hypertension     History of Loss of Consciousness: No  Seizure History: No  Cardiac History: No   Allergies: No Known Allergies   Current Medications:  History of Loss of Consciousness: No  Seizure History: No  Cardiac History: Yes-Aortic Stenosis   Allergies: No Known Allergies   Current Medications: Reviewed Current Outpatient Prescriptions on File Prior to Visit  Medication Sig Dispense Refill  . BYDUREON 2 MG SUSR INJECT INTO SKIN ONCE A WEEK AS DIRECTED  4 each  2  . BYDUREON 2 MG SUSR INJECT INTO SKIN ONCE A WEEK AS DIRECTED  4 each  2  . furosemide (LASIX) 20 MG tablet Take 1 tablet (20 mg total) by mouth daily.  90 tablet  0  . lamoTRIgine (LAMICTAL) 100 MG tablet Take 1 tablet (100 mg total) by mouth 2 (two) times daily.  180 tablet  1  . lisinopril (PRINIVIL,ZESTRIL) 40 MG tablet Take 1 tablet (40 mg total) by mouth daily.  90 tablet  1  . lithium carbonate 150 MG capsule Take one capsule (150 mg)  for 14 days, then increase to 2  capsules (300 mg ) thereafter.  60 capsule  1  . LOVAZA 1 G capsule TAKE 1 CAPSULE FOUR TIMES A DAY  360 capsule  2  . methylphenidate (CONCERTA) 36 MG CR tablet Take 1  tablet (36 mg total) by mouth every morning. Fill on or after 10/25/2012.  30 tablet  0  . oxcarbazepine (TRILEPTAL) 600 MG tablet Take 1 tablet (600 mg total) by mouth 3 (three) times daily.  270 tablet  0  . SitaGLIPtin-MetFORMIN HCl (JANUMET XR) [phone] MG TB24 Take 1 tablet by mouth daily.  90 tablet  0   Previous Psychotropic Medications: Reviewed  Medication  Dose   Desipramine  unknown   Nortriptyline  unknown   Wellbutrin  unknown    Substance Abuse History in the last 12 months: Reviewed  Caffeine: . Caffeine Beverages 0-16 ounces per day.  Nicotine: Patient denies.  Alcohol: Patient denies.  Illicit Drugs: Patient denies.   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  Hobbies/Interests: computers   Family History: Reviewed  Family History  Problem Relation Age of Onset  . ADD / ADHD Mother   . Hyperlipidemia Mother   . Hypertension Mother   . Dementia Mother   . ADD / ADHD Brother   . ADD / ADHD Son   . ADD / ADHD Son   . ADD / ADHD Daughter   . Prostate cancer Father 20  . Alzheimer's disease Father   . Colon cancer Father   . Stomach cancer Father   . Heart attack Maternal Grandmother    Mental Status Examination/Evaluation:  Objective: Appearance: Casual   Eye Contact:: Good   Speech: Clear and Coherent and Normal Rate   Volume: Normal   Mood: "good"   Affect: Appropriate   Thought Process:  Circumstantial   Orientation: Full   Thought Content: WDL   Suicidal Thoughts: No   Homicidal Thoughts: No   Judgement: Fair   Insight: Good   Psychomotor Activity: Normal   Akathisia: No   Handed: Right   AIMS (if indicated): Not indicated   Memory: Immediate 3/3, recent 3/3   Assets: Communication Skills  Desire for Improvement  Financial Resources/Insurance  Housing  Social Support  Talents/Skills  Transportation  Vocational/Educational    Assessment:  AXIS I  Bipolar II disorder, most recent episode hypomanic, Attention Deficit Hyperactivity Disorder   AXIS II  No diagnosis   AXIS III  Aortic Stenosis, congenital   AXIS IV  economic problems, other psychosocial or environmental problems, problems related to social environment and problems with primary support group   AXIS V  51-60 moderate symptoms    Treatment Plan/Recommendations:  1. Affirm with the patient that the medications are taken as ordered. Patient  expressed understanding of how their medications were to be used.  2. Continue the following psychiatric medications as written prior to this appointment::  a) Trileptal 600 mg-one tablet three times daily. Will taper and stop this medication, if possible. b) Lamotrigine 100 mg two tablets  daily  c) Concerta 36 mg- D) Increase Lithium 150 mg, take three tablets for two weeks, then increase to 4 tablets daily. 3. Therapy: brief supportive therapy provided. Discussed psychosocial stressors. Discussed importance of control of diabetes.  4. Risks and benefits, side effects and alternatives discussed with patient, he was given an opportunity to ask questions about his/her medication, illness, and treatment. All current psychiatric medications have been reviewed and discussed with the patient and adjusted as clinically appropriate. The patient has been provided an accurate and updated list of the medications being now prescribed.  5. Patient told to call clinic if any  problems occur. Patient advised to go to ER if he should develop SI/HI, side effects, or if symptoms worsen. Has crisis numbers to call if needed.  6. Will order drug screen and continue random urine drug screens as long as patient is on concerta.  7. Will order BUN/Creatinine, Lithium, trileptal and lamictal levels, 8. The patient was encouraged to keep all PCP and specialty clinic appointments.  9. Patient was instructed to return to clinic in 1 month.  10. The patient expressed understanding of the plan outlined above and agrees with the plan.    Jacqulyn Cane, MD

## 2012-12-01 ENCOUNTER — Encounter (HOSPITAL_COMMUNITY): Payer: Self-pay | Admitting: Behavioral Health

## 2012-12-01 NOTE — Progress Notes (Unsigned)
Outpatient Therapist Discharge Summary  Cody Barnett    December 18, 1961   Admission Date: ***   Discharge Date:  *** Reason for Discharge:  *** Diagnosis:  Axis I:  No diagnosis found.  Axis II:  ***  Axis III:  ***  Axis IV:  ***  Axis V:  ***  Comments:  ***  [name]

## 2012-12-01 NOTE — Progress Notes (Signed)
Outpatient Therapist Discharge Summary  Cody Barnett    Jul 08, 1961   [name]  Outpatient Therapist Discharge Summary  Cody Barnett    Apr 20, 1961   Admission Date: 11/11/11   Discharge Date:  07/19/12 Reason for Discharge:  The client did not return. Diagnosis:  Axis I:  Mood disorder  Axis II:  deferred  Axis III:    Axis IV:  Family stressors  Axis V:  56  Comments:  The client did not return for therapy. No reason or explanation was given.  [name]

## 2012-12-08 ENCOUNTER — Ambulatory Visit (INDEPENDENT_AMBULATORY_CARE_PROVIDER_SITE_OTHER): Payer: PRIVATE HEALTH INSURANCE | Admitting: Family Medicine

## 2012-12-08 ENCOUNTER — Encounter: Payer: Self-pay | Admitting: Family Medicine

## 2012-12-08 VITALS — BP 115/73 | HR 83 | Temp 98.5°F | Ht 71.0 in | Wt 302.0 lb

## 2012-12-08 DIAGNOSIS — K529 Noninfective gastroenteritis and colitis, unspecified: Secondary | ICD-10-CM

## 2012-12-08 DIAGNOSIS — K5289 Other specified noninfective gastroenteritis and colitis: Secondary | ICD-10-CM

## 2012-12-08 DIAGNOSIS — Z23 Encounter for immunization: Secondary | ICD-10-CM

## 2012-12-08 DIAGNOSIS — E119 Type 2 diabetes mellitus without complications: Secondary | ICD-10-CM

## 2012-12-08 MED ORDER — GLIPIZIDE ER 5 MG PO TB24: 5.0000 mg | ORAL_TABLET | Freq: Every day | ORAL | Status: DC

## 2012-12-08 MED ORDER — AMBULATORY NON FORMULARY MEDICATION: Status: AC

## 2012-12-08 NOTE — Progress Notes (Signed)
  Subjective:    Patient ID: Cody Barnett, male    DOB: 1961-08-14, 51 y.o.   MRN: [id number]  HPI No cough or nasal congestion but for the last 4 days started getting severe joint pain and then next day started vomiting.  + fever/chills. But feels better today.  No fever today.  Still some HA today.    DM- HAs been monitoring his sugars more often.  Says has really been trying to eat a more moderate lunch.  No hypoglycemic events.  Sugars are riding around 200-250.    Review of Systems     Objective:   Physical Exam  Constitutional: He is oriented to person, place, and time. He appears well-developed and well-nourished.  HENT:  Head: Normocephalic and atraumatic.  Right Ear: External ear normal.  Left Ear: External ear normal.  Nose: Nose normal.  Mouth/Throat: Oropharynx is clear and moist.       TMs and canals are clear.   Eyes: Conjunctivae normal and EOM are normal. Pupils are equal, round, and reactive to light.  Neck: Neck supple. No thyromegaly present.  Cardiovascular: Normal rate and normal heart sounds.   Pulmonary/Chest: Effort normal and breath sounds normal.  Lymphadenopathy:    He has no cervical adenopathy.  Neurological: He is alert and oriented to person, place, and time.  Skin: Skin is warm and dry.  Psychiatric: He has a normal mood and affect.          Assessment & Plan:  Gastroenteritis - Resolving.  Continue to work on oral hydration. He has not vomited today and has not had a fever today. Okay to return to work tomorrow. Work note given.  DM- Uncontrolled. Add glipizide..  F/u in 6 weeks.  Continue to work on diet and exercise. Bring in sugar log with him.  He can test before each meal. He feels it helps him determine what to eat for his meal. Reminded him to get up to date eye exam.

## 2012-12-08 NOTE — Patient Instructions (Signed)
Remember to get your yearly eye exam. Please have them send her a report when you go. Followup in 6 weeks so that we can track your sugars and make adjustments again before you're due for your next hemoglobin A1c.

## 2012-12-18 ENCOUNTER — Other Ambulatory Visit: Payer: Self-pay | Admitting: Family Medicine

## 2012-12-25 ENCOUNTER — Other Ambulatory Visit: Payer: Self-pay | Admitting: Family Medicine

## 2012-12-27 ENCOUNTER — Encounter (HOSPITAL_COMMUNITY): Payer: Self-pay | Admitting: Psychiatry

## 2012-12-27 ENCOUNTER — Ambulatory Visit (INDEPENDENT_AMBULATORY_CARE_PROVIDER_SITE_OTHER): Payer: PRIVATE HEALTH INSURANCE | Admitting: Psychiatry

## 2012-12-27 VITALS — BP 135/78 | HR 62 | Ht 71.0 in | Wt 287.0 lb

## 2012-12-27 DIAGNOSIS — F3181 Bipolar II disorder: Secondary | ICD-10-CM

## 2012-12-27 DIAGNOSIS — F909 Attention-deficit hyperactivity disorder, unspecified type: Secondary | ICD-10-CM

## 2012-12-27 DIAGNOSIS — F3110 Bipolar disorder, current episode manic without psychotic features, unspecified: Secondary | ICD-10-CM

## 2012-12-27 MED ORDER — METHYLPHENIDATE HCL ER (OSM) 36 MG PO TBCR: 36.0000 mg | EXTENDED_RELEASE_TABLET | ORAL | Status: DC

## 2012-12-27 MED ORDER — LITHIUM CARBONATE 150 MG PO CAPS: ORAL_CAPSULE | ORAL | Status: DC

## 2012-12-27 NOTE — Progress Notes (Addendum)
[facility] Behavioral Health Follow-up Outpatient Visit  Cody Barnett [birth year]  Date: 12/27/2012  HPI Comments: SUBJECTIVE: [name] is a 52 year old male with a diagnosis of Attention Deficit Hyperactivity Disorder, Hyperactive type and Bipolar II Disorder, mixed.   The patient reports he was sick with an intestinal virus and an as a result has not taken Lamictal, lithium, concerta, or trileptal for one week, secondary to vomiting.  The patient reports that he has restarted only lithium and has been off Lamictal and carbamazepine 3 weeks. He states that he has had some leg aches and twitches for the following week but no longer has any symptoms.  The patient reports he is taking Lithium 450 mg daily for the past 3 weeks but continues to have some "flashes" of anger but with provocation such as some else getting hurt or getting double billed.  He states he is attempting to control his blood sugars and has been started on another hypoglycemic.  In the area of affective symptoms, patient appears euthymic. Patient denies current suicidal ideation, intent, or plan. Patient denies current homicidal ideation, intent, or plan. Patient denies auditory hallucinations. Patient denies visual hallucinations. Patient denies symptoms of paranoia. Patient states sleep is fair, with approximately 5-7 hours of sleep per night.  Appetite is fair. Energy level is fair. Patient denies symptoms of anhedonia. Patient denies hopelessness, helplessness, or guilt.   He denies recent episodes consistent with mania, particularly decreased need for sleep with increased energy, grandiosity, impulsivity, hyperverbal and pressured speech, and increased productivity. Denies any recent symptoms consistent with psychosis, particularly auditory or visual hallucinations, thought broadcasting/insertion/withdrawal, or ideas of reference. Also denies excessive worry to the point of physical symptoms as well as any panic  attacks. Denies any history of trauma or symptoms consistent with PTSD such as flashbacks, nightmares, hypervigilance, feelings of numbness or inability to connect with others.   Review of Systems  Constitutional: Positive for weight loss. Negative for fever and chills.  Respiratory: Positive for sputum production. Negative for cough, hemoptysis and shortness of breath.   Cardiovascular: Negative for chest pain, palpitations and leg swelling.  Gastrointestinal: Negative for nausea, vomiting, diarrhea and constipation.  Neurological: Negative for weakness.   Filed Vitals:   12/27/12 1049  BP: 135/78  Pulse: 62  Height: 5\' 11"  (1.803 m)  Weight: 287 lb (130.182 kg)   Physical Exam  Constitutional: He appears well-developed and well-nourished. No distress.  Skin: He is not diaphoretic.   Past Psychiatric History:  Reviewed Diagnosis: Bipolar, ADHD, ADD current   Hospitalizations: None   Outpatient Care: [facility]   Substance Abuse Care: Remote   Self-Mutilation:Patient denies   Suicidal Attempts: Patient denies   Violent Behaviors: extreme anger    Past Medical History: Reviewed  Past Medical History  Diagnosis Date  . Hypertension    History of Loss of Consciousness: No  Seizure History: No  Cardiac History: No   Allergies: No Known Allergies   Current Medications:  History of Loss of Consciousness: No  Seizure History: No  Cardiac History: Yes-Aortic Stenosis   Allergies: No Known Allergies   Current Medications: Reviewed Current Outpatient Prescriptions on File Prior to Visit  Medication Sig Dispense Refill  . AMBULATORY NON FORMULARY MEDICATION Medication Name:Test strip to test 3 x a day. Dx 250.02. 90 days supply  200 Units  11  . BYDUREON 2 MG SUSR INJECT INTO SKIN ONCE A WEEK AS DIRECTED  4 each  2  . furosemide (  LASIX) 20 MG tablet TAKE 1 TABLET (20 MG TOTAL) BY MOUTH DAILY.  90 tablet  0  . glipiZIDE (GLUCOTROL XL) 5 MG 24 hr tablet Take 1 tablet (5 mg  total) by mouth daily.  30 tablet  2  . JANUMET XR [phone] MG TB24 TAKE 1 TABLET BY MOUTH DAILY.  90 tablet  0  . lisinopril (PRINIVIL,ZESTRIL) 40 MG tablet Take 1 tablet (40 mg total) by mouth daily.  90 tablet  1  . lithium carbonate 150 MG capsule Take two capsule (300 mg)  twice daily.  120 capsule  1  . LOVAZA 1 G capsule TAKE 1 CAPSULE FOUR TIMES A DAY  360 capsule  2  . methylphenidate (CONCERTA) 36 MG CR tablet Take 1 tablet (36 mg total) by mouth every morning. Fill on or after 12/30/2012.  30 tablet  0   Previous Psychotropic Medications: Reviewed  Medication  Dose   Desipramine  unknown   Nortriptyline  unknown   Wellbutrin  unknown    Substance Abuse History in the last 12 months: Reviewed  Caffeine: . Caffeine Beverages 0-16 ounces per day.  Nicotine: Patient denies.  Alcohol: Patient denies.  Illicit Drugs: Patient denies.   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  Hobbies/Interests: computers   Family History: Reviewed  Family History  Problem Relation Age of Onset  . ADD / ADHD Mother   . Hyperlipidemia Mother   . Hypertension Mother   . Dementia Mother   . ADD / ADHD Brother   . ADD / ADHD Son   . ADD / ADHD Son   . ADD / ADHD Daughter   . Prostate cancer Father 41  . Alzheimer's disease Father   . Colon cancer Father   . Stomach cancer Father   . Heart attack Maternal Grandmother    Mental  Status Examination/Evaluation:  Objective: Appearance: Casual   Eye Contact:: Good   Speech: Clear and Coherent and Normal Rate   Volume: Normal   Mood: "good"   Affect: Appropriate   Thought Process: Circumstantial   Orientation: Full   Thought Content: WDL   Suicidal Thoughts: No   Homicidal Thoughts: No   Judgement: Fair   Insight: Good   Psychomotor Activity: Normal   Akathisia: No   Handed: Right   AIMS (if indicated): Not indicated   Memory: Intact  Assets: Communication Skills  Desire for Improvement  Financial Resources/Insurance  Housing  Social Support  Talents/Skills  Transportation  Vocational/Educational    Assessment:  AXIS I  Bipolar II disorder, most recent episode hypomanic, Attention Deficit Hyperactivity Disorder   AXIS II  No diagnosis   AXIS III  Aortic Stenosis, congenital   AXIS IV  economic problems, other psychosocial or environmental problems, problems related to social environment and problems with primary support group   AXIS V  51-60 moderate symptoms    Treatment Plan/Recommendations:  1. Affirm with the patient that the medications are taken as ordered. Patient expressed understanding of how  their medications were to be used.  2. Continue the following psychiatric medications as written prior to this appointment::  a) Discontinue Trileptal-patient stopped this medication over 3 weeks ago b) Discontinue Lamotrigine-patient stopped this medication over 3 weeks ago c) Concerta 36 mg-one tablet daily. Provided prescription. D) Increase Lithium 150 mg-  increase to 4 tablets daily. 3. Therapy: brief supportive therapy provided. Discussed psychosocial stressors. Discussed importance of control of diabetes.  4. Risks and benefits, side effects and alternatives discussed with patient, he was given an opportunity to ask questions about his/her medication, illness, and treatment. All current psychiatric medications have been reviewed and discussed with  the patient and adjusted as clinically appropriate. The patient has been provided an accurate and updated list of the medications being now prescribed.  5. Patient told to call clinic if any problems occur. Patient advised to go to ER if he should develop SI/HI, side effects, or if symptoms worsen. Has crisis numbers to call if needed.  6. Will order drug screen and continue random urine drug screens as long as patient is on concerta.  7. Will order Urine drug screen. Will do lithium level before next visit. 8. The patient was encouraged to keep all PCP and specialty clinic appointments.  9. Patient was instructed to return to clinic in 2 month.  10. The patient expressed understanding of the plan outlined above and agrees with the plan.    Jacqulyn Cane, MD

## 2012-12-28 LAB — DRUG SCREEN, URINE
Amphetamine Screen, Ur: NEGATIVE
Barbiturate Quant, Ur: NEGATIVE
Cocaine Metabolites: NEGATIVE
Methadone: NEGATIVE
Opiates: NEGATIVE

## 2013-01-02 ENCOUNTER — Ambulatory Visit (HOSPITAL_COMMUNITY): Payer: Self-pay | Admitting: Psychiatry

## 2013-01-19 ENCOUNTER — Ambulatory Visit: Payer: Self-pay | Admitting: Family Medicine

## 2013-01-19 DIAGNOSIS — Z0289 Encounter for other administrative examinations: Secondary | ICD-10-CM

## 2013-01-25 ENCOUNTER — Other Ambulatory Visit (HOSPITAL_COMMUNITY): Payer: Self-pay

## 2013-01-25 DIAGNOSIS — F909 Attention-deficit hyperactivity disorder, unspecified type: Secondary | ICD-10-CM

## 2013-01-25 MED ORDER — METHYLPHENIDATE HCL ER (OSM) 36 MG PO TBCR: 36.0000 mg | EXTENDED_RELEASE_TABLET | ORAL | Status: DC

## 2013-01-25 NOTE — Telephone Encounter (Signed)
Patient called. Have provided prescription for COncerta 36 mg  #30.

## 2013-02-09 ENCOUNTER — Other Ambulatory Visit: Payer: Self-pay | Admitting: Family Medicine

## 2013-02-20 ENCOUNTER — Telehealth (HOSPITAL_COMMUNITY): Payer: Self-pay | Admitting: Psychiatry

## 2013-02-20 NOTE — Telephone Encounter (Signed)
Called patient. Reminded patient to get Lithium level done this week prior to appointment.

## 2013-02-21 ENCOUNTER — Other Ambulatory Visit (HOSPITAL_COMMUNITY): Payer: Self-pay | Admitting: Psychiatry

## 2013-02-22 LAB — LITHIUM LEVEL: Lithium Lvl: 0.2 mEq/L — ABNORMAL LOW (ref 0.80–1.40)

## 2013-02-24 ENCOUNTER — Ambulatory Visit (HOSPITAL_COMMUNITY): Payer: Self-pay | Admitting: Psychiatry

## 2013-02-27 ENCOUNTER — Telehealth (HOSPITAL_COMMUNITY): Payer: Self-pay

## 2013-02-27 DIAGNOSIS — F909 Attention-deficit hyperactivity disorder, unspecified type: Secondary | ICD-10-CM

## 2013-02-27 DIAGNOSIS — F3181 Bipolar II disorder: Secondary | ICD-10-CM

## 2013-02-27 MED ORDER — METHYLPHENIDATE HCL ER (OSM) 36 MG PO TBCR: 36.0000 mg | EXTENDED_RELEASE_TABLET | ORAL | Status: DC

## 2013-02-27 MED ORDER — LITHIUM CARBONATE 300 MG PO CAPS: 300.0000 mg | ORAL_CAPSULE | Freq: Two times a day (BID) | ORAL | Status: DC

## 2013-02-27 NOTE — Telephone Encounter (Signed)
Called patient. Will fill Concerta. Discussed lab result.  Patient's Lithium level at 0.20 and he reports his mood is improving.    PLAN: Increase Lithium to 750 mg for 7 days.

## 2013-02-27 NOTE — Telephone Encounter (Signed)
Needs concerta rx  

## 2013-03-02 ENCOUNTER — Encounter (HOSPITAL_COMMUNITY): Payer: Self-pay | Admitting: Psychiatry

## 2013-03-02 ENCOUNTER — Ambulatory Visit (INDEPENDENT_AMBULATORY_CARE_PROVIDER_SITE_OTHER): Payer: PRIVATE HEALTH INSURANCE | Admitting: Psychiatry

## 2013-03-02 VITALS — BP 122/83 | HR 96 | Ht 71.0 in | Wt 300.0 lb

## 2013-03-02 NOTE — Progress Notes (Signed)
[facility] Behavioral Health Follow-up Outpatient Visit  Cody Barnett February 27, 1961  Date: 03/02/2013  HPI Comments: SUBJECTIVE: Cody Barnett is a 52 year old male with a diagnosis of Attention Deficit Hyperactivity Disorder, Hyperactive type and Bipolar II Disorder, mixed.   The patient reports he can tell that his mood and concentration are better when his blood sugars are well controlled.  He states he has been exercising and trying to drink an adequate amount of sleep.  He reports his moods have been underr better control but continues to have. He reports that he is taking his medications as prescribed and denies any side effects.  In the area of affective symptoms, patient appears euthymic. Patient denies current suicidal ideation, intent, or plan. Patient denies current homicidal ideation, intent, or plan. Patient denies auditory hallucinations. Patient denies visual hallucinations. Patient denies symptoms of paranoia. Patient states sleep is fair, with approximately 7 hours of sleep per night with a change of mattresses  Appetite is increased. Energy level is fair. Patient denies symptoms of anhedonia. Patient denies hopelessness, helplessness, or guilt.   He denies recent episodes consistent with mania, particularly decreased need for sleep with increased energy, grandiosity, impulsivity, hyper verbal and pressured speech, and increased productivity. Denies any recent symptoms consistent with psychosis, particularly auditory or visual hallucinations, thought broadcasting/insertion/withdrawal, or ideas of reference. Also denies excessive worry to the point of physical symptoms as well as any panic attacks. Denies any history of trauma or symptoms consistent with PTSD such as flashbacks, nightmares, hypervigilance, feelings of numbness or inability to connect with others.   Review of Systems  Constitutional: Positive for weight loss. Negative for fever and chills.  Respiratory:  Positive for sputum production. Negative for cough, hemoptysis and shortness of breath.   Cardiovascular: Negative for chest pain, palpitations and leg swelling.  Gastrointestinal: Negative for nausea, vomiting, diarrhea and constipation.  Neurological: Negative for weakness.   Filed Vitals:   03/02/13 1520  BP: 122/83  Pulse: 96  Height: 5\' 11"  (1.803 m)  Weight: 300 lb (136.079 kg)   Physical Exam  Constitutional: He appears well-developed and well-nourished. No distress.  Skin: He is not diaphoretic.   Past Psychiatric History:  Reviewed Diagnosis: Bipolar, ADHD, ADD current   Hospitalizations: None   Outpatient Care: [facility]   Substance Abuse Care: Remote   Self-Mutilation:Patient denies   Suicidal Attempts: Patient denies   Violent Behaviors: extreme anger    Past Medical History: Reviewed  Past Medical History  Diagnosis Date  . Hypertension    History of Loss of Consciousness: No  Seizure History: No  Cardiac History: No   Allergies: No Known Allergies   Current Medications:  History of Loss of Consciousness: No  Seizure History: No  Cardiac History: Yes-Aortic Stenosis   Allergies: No Known Allergies   Current Medications: Reviewed Current Outpatient Prescriptions on File Prior to Visit  Medication Sig Dispense Refill  . AMBULATORY NON FORMULARY MEDICATION Medication Name:Test strip to test 3 x a day. Dx 250.02. 90 days supply  200 Units  11  . BYDUREON 2 MG SUSR INJECT INTO SKIN ONCE A WEEK AS DIRECTED  4 each  2  . furosemide (LASIX) 20 MG tablet TAKE 1 TABLET (20 MG TOTAL) BY MOUTH DAILY.  90 tablet  0  . glipiZIDE (GLUCOTROL XL) 5 MG 24 hr tablet Take 1 tablet (5 mg total) by mouth daily.  30 tablet  2  . JANUMET XR [phone] MG TB24 TAKE 1 TABLET BY  MOUTH DAILY.  90 tablet  0  . lisinopril (PRINIVIL,ZESTRIL) 40 MG tablet Take 1 tablet (40 mg total) by mouth daily.  90 tablet  1  . lithium carbonate 300 MG capsule Take 1 capsule (300 mg total) by mouth  2 (two) times daily with a meal. Take two capsule (300 mg)  twice daily.  120 capsule  1  . LOVAZA 1 G capsule TAKE 1 CAPSULE FOUR TIMES A DAY  360 capsule  2  . methylphenidate (CONCERTA) 36 MG CR tablet Take 1 tablet (36 mg total) by mouth every morning. Fill on or after 12/30/2012.  30 tablet  0  . ONE TOUCH ULTRA TEST test strip        No current facility-administered medications on file prior to visit.   Previous Psychotropic Medications: Reviewed  Medication  Dose   Desipramine  unknown   Nortriptyline  unknown   Wellbutrin  unknown    Substance Abuse History in the last 12 months: Reviewed  Caffeine: . Caffeine Beverages 0-16 ounces per day.  Nicotine: Patient denies.  Alcohol: Patient denies.  Illicit Drugs: Patient denies.   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  Hobbies/Interests: computers   Family History: Reviewed  Family History  Problem Relation Age of Onset  . ADD / ADHD Mother   . Hyperlipidemia Mother   . Hypertension Mother   . Dementia Mother   . ADD / ADHD Brother   . ADD / ADHD Son   . ADD / ADHD Son   . ADD / ADHD Daughter   . Prostate cancer Father 45  . Alzheimer's disease Father   . Colon cancer Father   . Stomach cancer Father   . Heart attack Maternal Grandmother    Mental Status Examination/Evaluation:  Objective: Appearance: Casual   Eye Contact:: Good   Speech: Clear and  Coherent and Normal Rate   Volume: Normal   Mood: "more up than normal"  8/10  Affect: Appropriate   Thought Process: Circumstantial   Orientation: Full   Thought Content: WDL   Suicidal Thoughts: No   Homicidal Thoughts: No   Judgement: Fair   Insight: Good   Psychomotor Activity: Normal   Akathisia: No   Handed: Right   Memory: Immediate 3/3 recent-3/3  Memory: Intact  Assets: Communication Skills  Desire for Improvement  Financial Resources/Insurance  Housing  Social Support  Talents/Skills  Transportation  Vocational/Educational    Assessment:  AXIS I  Bipolar II disorder, most recent episode hypomanic, Attention Deficit Hyperactivity Disorder   AXIS II  No diagnosis   AXIS III  Aortic Stenosis, congenital   AXIS IV  economic problems, other psychosocial or environmental problems, problems related to social environment and problems with primary support group   AXIS V  51-60 moderate symptoms    Treatment Plan/Recommendations:  1. Affirm with the patient that the medications are taken as ordered. Patient expressed understanding of how their medications were to be used.  2. Continue the following psychiatric  medications as written prior to this appointment::  a) Increase Lithium 300 mg-  increase to 1 capsule in the morning and 2 capsules daily. b) Concerta 36 mg-one tablet daily. Provided prescription. 3. Therapy: brief supportive therapy provided. Discussed psychosocial stressors. Discussed importance of control of diabetes.  4. Risks and benefits, side effects and alternatives discussed with patient, he was given an opportunity to ask questions about his/her medication, illness, and treatment. All current psychiatric medications have been reviewed and discussed with the patient and adjusted as clinically appropriate. The patient has been provided an accurate and updated list of the medications being now prescribed.  5. Patient told to call clinic if any problems occur.  Patient advised to go to ER if he should develop SI/HI, side effects, or if symptoms worsen. Has crisis numbers to call if needed.  6. Will order drug screen and continue random urine drug screens as long as patient is on concerta.  7. Will order Urine drug screen. Will do lithium level before next visit. 8. The patient was encouraged to keep all PCP and specialty clinic appointments.  9. Patient was instructed to return to clinic in 2 month.  10. The patient expressed understanding of the plan outlined above and agrees with the plan.   Jacqulyn Cane, M.D.  03/02/2013 3:22 PM

## 2013-03-27 ENCOUNTER — Other Ambulatory Visit: Payer: Self-pay | Admitting: Family Medicine

## 2013-03-27 ENCOUNTER — Telehealth (HOSPITAL_COMMUNITY): Payer: Self-pay

## 2013-03-27 DIAGNOSIS — F909 Attention-deficit hyperactivity disorder, unspecified type: Secondary | ICD-10-CM

## 2013-03-27 MED ORDER — METHYLPHENIDATE HCL ER (OSM) 36 MG PO TBCR: 36.0000 mg | EXTENDED_RELEASE_TABLET | ORAL | Status: DC

## 2013-03-27 NOTE — Telephone Encounter (Signed)
Refill of Concerta 

## 2013-04-03 ENCOUNTER — Telehealth (HOSPITAL_COMMUNITY): Payer: Self-pay

## 2013-04-03 NOTE — Telephone Encounter (Signed)
Called patient. He reports that he was "not doing well," reporting that he has been having irritability and agitation when he starts to feel anxious.Marland Kitchen He report he stopped concerta with no difference, he then decreased his dose of Lithium and is currently taking 600 mg.  PLAN: Will have patient come in for an early appointment.

## 2013-04-04 ENCOUNTER — Encounter (HOSPITAL_COMMUNITY): Payer: Self-pay | Admitting: Psychiatry

## 2013-04-04 ENCOUNTER — Ambulatory Visit (INDEPENDENT_AMBULATORY_CARE_PROVIDER_SITE_OTHER): Payer: PRIVATE HEALTH INSURANCE | Admitting: Psychiatry

## 2013-04-04 VITALS — BP 148/72 | HR 68 | Ht 71.0 in | Wt 307.0 lb

## 2013-04-04 DIAGNOSIS — F909 Attention-deficit hyperactivity disorder, unspecified type: Secondary | ICD-10-CM

## 2013-04-04 DIAGNOSIS — F3189 Other bipolar disorder: Secondary | ICD-10-CM

## 2013-04-04 DIAGNOSIS — F3181 Bipolar II disorder: Secondary | ICD-10-CM

## 2013-04-04 MED ORDER — METHYLPHENIDATE HCL ER (OSM) 36 MG PO TBCR: 36.0000 mg | EXTENDED_RELEASE_TABLET | ORAL | Status: DC

## 2013-04-04 MED ORDER — LITHIUM CARBONATE 300 MG PO CAPS: 300.0000 mg | ORAL_CAPSULE | Freq: Two times a day (BID) | ORAL | Status: DC

## 2013-04-04 NOTE — Progress Notes (Signed)
[facility] Behavioral Health Follow-up Outpatient Visit  Cody Barnett Aug 01, 1961  Date: 04/04/2013  HPI Comments: SUBJECTIVE: Cody Barnett is a 52 year old male with a diagnosis of Attention Deficit Hyperactivity Disorder, Hyperactive type and Bipolar II Disorder, mixed.  Patient called the clinic reporting the following-"He reports that he was "not doing well," reporting that he has been having irritability and agitation when he starts to feel anxious.Marland Kitchen He report he stopped concerta with no difference, he then decreased his dose of Lithium and is currently taking 600 mg."   Today the patient reports that he has been having more stress at work due to the new open office plan. His office has removed work cubicles and now has every employee at tables in the same office area. He states the he has not been controlling his diet very well and not been checking his sugars routinely. He admits to having anxiety at work and home which he reports leaves him feeling foggy.  The patient reports that his wife is not working so as to help out with their child's education. He states that his child's grades have improved dramatically from F's to B's.  He still has problems with sleep as he will tend to walk up early several days to take his son to school, though his wife could do this for him.  In the area of affective symptoms, patient appears euthymic. Patient denies current suicidal ideation, intent, or plan. Patient denies current homicidal ideation, intent, or plan. Patient denies auditory hallucinations. Patient denies visual hallucinations. Patient denies symptoms of paranoia. Patient states sleep is fair, with approximately 5-7 hours of sleep per night with a change of mattresses  Appetite is increased. Energy level is fair. Patient denies symptoms of anhedonia. Patient denies hopelessness, helplessness, or guilt.   He denies recent episodes consistent with mania, particularly decreased need for  sleep with increased energy, grandiosity, impulsivity, hyper verbal and pressured speech, and increased productivity. Denies any recent symptoms consistent with psychosis, particularly auditory or visual hallucinations, thought broadcasting/insertion/withdrawal, or ideas of reference. Also denies excessive worry to the point of physical symptoms as well as any panic attacks. Denies any history of trauma or symptoms consistent with PTSD such as flashbacks, nightmares, hypervigilance, feelings of numbness or inability to connect with others.   Review of Systems  Constitutional: Positive for weight loss. Negative for fever and chills.  Respiratory: Positive for sputum production. Negative for cough, hemoptysis and shortness of breath.   Cardiovascular: Negative for chest pain, palpitations and leg swelling.  Gastrointestinal: Negative for nausea, vomiting, diarrhea and constipation.  Neurological: Negative for weakness.   Filed Vitals:   04/04/13 1325  BP: 148/72  Pulse: 68  Height: 5\' 11"  (1.803 m)  Weight: 307 lb (139.254 kg)    Physical Exam  Constitutional: He appears well-developed and well-nourished. No distress.  Skin: He is not diaphoretic.   Past Psychiatric History:  Reviewed Diagnosis: Bipolar II disorder, most recent episode hypomanic, Attention Deficit Hyperactivity Disorder   Hospitalizations: None   Outpatient Care: [facility]   Substance Abuse Care: Remote   Self-Mutilation:Patient denies   Suicidal Attempts: Patient denies   Violent Behaviors: extreme anger    Past Medical History: Reviewed  Past Medical History  Diagnosis Date  . Bipolar disorder   . Anxiety   . ADD (attention deficit disorder with hyperactivity)   . Hypertension    History of Loss of Consciousness: No  Seizure History: No  Cardiac History: No   Allergies:  No Known Allergies   Current Medications:  History of Loss of Consciousness: No  Seizure History: No  Cardiac History: Yes-Aortic Stenosis    Allergies: No Known Allergies   Current Medications: Reviewed Current Outpatient Prescriptions on File Prior to Visit  Medication Sig Dispense Refill  . AMBULATORY NON FORMULARY MEDICATION Medication Name:Test strip to test 3 x a day. Dx 250.02. 90 days supply  200 Units  11  . BYDUREON 2 MG SUSR INJECT INTO SKIN ONCE A WEEK AS DIRECTED  4 each  2  . furosemide (LASIX) 20 MG tablet TAKE 1 TABLET (20 MG TOTAL) BY MOUTH DAILY.  90 tablet  0  . glipiZIDE (GLUCOTROL XL) 5 MG 24 hr tablet TAKE 1 TABLET (5 MG TOTAL) BY MOUTH DAILY.  30 tablet  2  . JANUMET XR [phone] MG TB24 TAKE 1 TABLET BY MOUTH DAILY.  90 tablet  0  . lisinopril (PRINIVIL,ZESTRIL) 40 MG tablet TAKE 1 TABLET (40 MG TOTAL) BY MOUTH DAILY.  90 tablet  1  . lithium carbonate 300 MG capsule Take 1 capsule (300 mg total) by mouth 2 (two) times daily with a meal. Take two capsule (300 mg)  twice daily.  120 capsule  1  . LOVAZA 1 G capsule TAKE 1 CAPSULE FOUR TIMES A DAY  360 capsule  2  . methylphenidate (CONCERTA) 36 MG CR tablet Take 1 tablet (36 mg total) by mouth every morning.  30 tablet  0  . ONE TOUCH ULTRA TEST test strip        No current facility-administered medications on file prior to visit.   Previous Psychotropic Medications: Reviewed  Medication  Dose   Desipramine  unknown   Nortriptyline  unknown   Wellbutrin  unknown    Substance Abuse History in the last 12 months: Reviewed  Caffeine: . Caffeine Beverages 0-16 ounces per day.  Nicotine: Patient denies.  Alcohol: Patient denies.  Illicit Drugs: Patient denies.   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  Hobbies/Interests: computers   Family History: Reviewed  Family History  Problem Relation Age of Onset  . ADD / ADHD Mother   . Hyperlipidemia Mother   . Hypertension Mother   . Dementia Mother   . ADD / ADHD Brother   . ADD / ADHD Son   . ADD / ADHD Son   . ADD / ADHD Daughter   . Prostate cancer Father 29  . Alzheimer's disease Father   . Colon cancer Father   . Stomach cancer Father   . Heart attack Maternal Grandmother    Mental Status Examination/Evaluation:  Objective: Appearance: Casual   Eye Contact:: Good   Speech: Clear and Coherent and Normal Rate   Volume: Normal   Mood: "more up than normal"  8/10  Affect: Appropriate   Thought Process: Circumstantial   Orientation: Full   Thought Content: WDL   Suicidal Thoughts: No   Homicidal Thoughts: No   Judgement: Fair   Insight: Good  Psychomotor Activity: Normal   Akathisia: No   Handed: Right   Memory: Immediate 3/3 recent-3/3  Memory: Intact  Assets: Communication Skills  Desire for Improvement  Financial Resources/Insurance  Housing  Social Support  Talents/Skills  Transportation  Vocational/Educational    Assessment:  AXIS I  Bipolar II disorder, most recent episode hypomanic, Attention Deficit Hyperactivity Disorder   AXIS II  No diagnosis   AXIS III  Aortic Stenosis, congenital   AXIS IV  economic problems, other psychosocial or environmental problems, problems related to social environment and problems with primary support group   AXIS V  51-60 moderate symptoms    Treatment Plan/Recommendations:  1. Affirm with the patient that the medications are taken as ordered. Patient expressed understanding of how their medications were to be used.  2. Continue the following  psychiatric medications as written prior to this appointment::  a) Continue  Lithium 300 mg-  1 capsule in the morning and 2 capsules daily. b) Concerta 36 mg-one tablet daily. Provided prescription. 3. Therapy: brief supportive therapy provided. Discussed psychosocial stressors. Discussed importance of control of diabetes for control of mood symptoms.  4. Risks and benefits, side effects and alternatives discussed with patient, he was given an opportunity to ask questions about his medication, illness, and treatment. All current psychiatric medications have been reviewed and discussed with the patient and adjusted as clinically appropriate. The patient has been provided an accurate and updated list of the medications being now prescribed.  5. Patient told to call clinic if any problems occur. Patient advised to go to ER if he should develop SI/HI, side effects, or if symptoms worsen. Has crisis numbers to call if needed.  6. Will order drug screen and continue random urine drug screens as long as patient is on concerta.  7. Will order Urine drug screen. Will do lithium level before next visit. 8. The patient was encouraged to keep all PCP and specialty clinic appointments.  9. Patient was instructed to return to clinic in 2 month.  10. The patient expressed understanding of the plan outlined above and agrees with the plan.   Jacqulyn Cane, M.D.  04/04/2013 1:23 PM

## 2013-04-06 ENCOUNTER — Telehealth (HOSPITAL_COMMUNITY): Payer: Self-pay

## 2013-04-06 DIAGNOSIS — F3181 Bipolar II disorder: Secondary | ICD-10-CM

## 2013-04-06 DIAGNOSIS — F909 Attention-deficit hyperactivity disorder, unspecified type: Secondary | ICD-10-CM

## 2013-04-06 LAB — LIPID PANEL
LDL Cholesterol: 116 mg/dL — ABNORMAL HIGH (ref 0–99)
Triglycerides: 197 mg/dL — ABNORMAL HIGH (ref <150)
VLDL: 39 mg/dL (ref 0–40)

## 2013-04-06 LAB — COMPREHENSIVE METABOLIC PANEL
ALT: 19 U/L (ref 0–53)
AST: 24 U/L (ref 0–37)
Albumin: 4.7 g/dL (ref 3.5–5.2)
Alkaline Phosphatase: 75 U/L (ref 39–117)
BUN: 11 mg/dL (ref 6–23)
CO2: 29 mEq/L (ref 19–32)
Calcium: 9.8 mg/dL (ref 8.4–10.5)
Chloride: 100 mEq/L (ref 96–112)
Creat: 1.14 mg/dL (ref 0.50–1.35)
Glucose, Bld: 77 mg/dL (ref 70–99)
Potassium: 4.4 mEq/L (ref 3.5–5.3)
Sodium: 140 mEq/L (ref 135–145)
Total Bilirubin: 0.6 mg/dL (ref 0.3–1.2)
Total Protein: 7.4 g/dL (ref 6.0–8.3)

## 2013-04-06 LAB — LITHIUM LEVEL: Lithium Lvl: 0.4 mEq/L — ABNORMAL LOW (ref 0.80–1.40)

## 2013-04-06 MED ORDER — LITHIUM CARBONATE 300 MG PO CAPS: ORAL_CAPSULE | ORAL | Status: DC

## 2013-04-06 NOTE — Addendum Note (Signed)
Addended by: Larena Sox on: 04/06/2013 01:58 PM   Modules accepted: Orders

## 2013-04-06 NOTE — Telephone Encounter (Addendum)
Called pharmacy.  Clarified prescription.

## 2013-04-10 ENCOUNTER — Encounter: Payer: Self-pay | Admitting: Family Medicine

## 2013-04-10 ENCOUNTER — Ambulatory Visit (INDEPENDENT_AMBULATORY_CARE_PROVIDER_SITE_OTHER): Payer: PRIVATE HEALTH INSURANCE | Admitting: Family Medicine

## 2013-04-10 VITALS — BP 119/64 | HR 68 | Ht 71.0 in | Wt 307.0 lb

## 2013-04-10 DIAGNOSIS — E119 Type 2 diabetes mellitus without complications: Secondary | ICD-10-CM

## 2013-04-10 DIAGNOSIS — I10 Essential (primary) hypertension: Secondary | ICD-10-CM

## 2013-04-10 LAB — POCT GLYCOSYLATED HEMOGLOBIN (HGB A1C): Hemoglobin A1C: 7.9

## 2013-04-10 MED ORDER — INSULIN GLARGINE 100 UNIT/ML ~~LOC~~ SOLN: 10.0000 [IU] | Freq: Every day | SUBCUTANEOUS | Status: DC

## 2013-04-10 NOTE — Progress Notes (Signed)
  Subjective:    Patient ID: Cody Barnett, male    DOB: 01-09-1961, 52 y.o.   MRN: [id number]  HPI DM- had labs drawn with Cody Barnett. He is interested in diabetic nutrition conseling.  No hypoglycemic events. He is not working out right now. He would like to be around 220 lbs. Using bydureon and Janumet. Glucose log shows AM sugars are running 140-200.    HTN- No CP or SOB.  Taking lisinopril and furosemide dialy. No on potassium but recent blood levels are normal.  Now on Lithium on the Bipolar. Bloodwork was drawn by his psychiatrist and we reviewed those results here today. His psychiatrist was concerned that his diabetes was not well controlled. There was a posterior and hemoglobin A1c the lab did not run it so we will do fingerstick in the office today.   Review of Systems     Objective:   Physical Exam  Constitutional: He is oriented to person, place, and time. He appears well-developed and well-nourished.  HENT:  Head: Normocephalic and atraumatic.  Cardiovascular: Normal rate, regular rhythm and normal heart sounds.   Pulmonary/Chest: Effort normal and breath sounds normal.  Musculoskeletal: He exhibits edema.  1+ pitting edema  Neurological: He is alert and oriented to person, place, and time.  Skin: Skin is warm and dry.  Psychiatric: He has a normal mood and affect. His behavior is normal.          Assessment & Plan:  DM- uncontrolled. We discussed the importance of controlling his diabetes to reduce his risk of heart attack stroke, renal failure etc. I also think it would help him manage his bipolar better. Even his psychiatrist a gun on him about thinking sure that his diabetes was under good control. Needs to be on statin as well. Discussed this as well. Doesn't want to do a statni right now.   Will refer for nutrition counseling. He is working on starting a weight loss progrma.  Refer to nutrition. Reminded him he is due for his eye exam. Followup in 6  weeks. Bring in glucose log. We will start Lantus 10 units at bedtime in addition to his Bydureon and Janumet.  He will increase his Lantus by 1 unit at bedtime each night until his morning sugars are consistently under 130. Once at goal he will continue that current dose until I see him back. Also encouraged him to get back into some type of exercise routine. Lab Results  Component Value Date   HGBA1C 7.9 04/10/2013   Hypertension-well-controlled. Continue current regimen. I think to be significantly improved with significant weight loss.  Time spent 25 minutes, greater than 50% counseling about controlling his diabetes and medication management for his diabetes.  Morbid Obesity - discussed diet plan. He would like to be referred to a nutritionist. I be happy to place a referral for him and encouraged him to bring his wife along with him to make changes for his family and not just for himself. Also encouraged regular exercise. We discussed a weight loss goal to about at least 220 pounds.

## 2013-04-10 NOTE — Patient Instructions (Addendum)
Please schedule your eye exam. We will refer you for nutrition therapy. Start lantus 10 units at bedtime, then increase by 1 unit a day until AM sugar is under 130.  Then stay at that dose.

## 2013-04-13 ENCOUNTER — Ambulatory Visit (HOSPITAL_COMMUNITY): Payer: Self-pay | Admitting: Psychiatry

## 2013-05-03 ENCOUNTER — Ambulatory Visit (INDEPENDENT_AMBULATORY_CARE_PROVIDER_SITE_OTHER): Payer: PRIVATE HEALTH INSURANCE | Admitting: Psychiatry

## 2013-05-03 ENCOUNTER — Encounter (HOSPITAL_COMMUNITY): Payer: Self-pay | Admitting: Psychiatry

## 2013-05-03 VITALS — BP 136/74 | HR 69 | Ht 71.0 in | Wt 316.0 lb

## 2013-05-03 DIAGNOSIS — F909 Attention-deficit hyperactivity disorder, unspecified type: Secondary | ICD-10-CM

## 2013-05-03 DIAGNOSIS — F3181 Bipolar II disorder: Secondary | ICD-10-CM

## 2013-05-03 DIAGNOSIS — F3189 Other bipolar disorder: Secondary | ICD-10-CM

## 2013-05-03 MED ORDER — LITHIUM CARBONATE ER 300 MG PO TBCR: 300.0000 mg | EXTENDED_RELEASE_TABLET | Freq: Every day | ORAL | Status: DC

## 2013-05-03 MED ORDER — LITHIUM CARBONATE ER 300 MG PO TBCR: 900.0000 mg | EXTENDED_RELEASE_TABLET | Freq: Every day | ORAL | Status: DC

## 2013-05-03 NOTE — Progress Notes (Signed)
[facility] Behavioral Health Follow-up Outpatient Visit  ALCIDE Barnett 1961/03/25  Date: 05/03/2013  HPI Comments: SUBJECTIVE: [name] is a 52 year old male with a diagnosis of Attention Deficit Hyperactivity Disorder, Hyperactive type and Bipolar II Disorder, mixed.   The patient reports that he has moved his Lithium to all at bedtime as he was getting agitated during the day and this seems to help. Now he is getting better sleep, but still getting irritated and agitated at home. The patient reports that he has not been agitated at work.  He states he has not been controlling his blood sugars and controlling his diet. He has a log of his blood sugars which shows several spikes indicating what he admits to as poor compliance to a diabetic diet.    In the area of affective symptoms, patient appears euthymic. Patient denies current suicidal ideation, intent, or plan. Patient denies current homicidal ideation, intent, or plan. Patient denies auditory hallucinations. Patient denies visual hallucinations. Patient denies symptoms of paranoia. Patient states sleep is fair, with approximately 6.5  hours of sleep per night.  Appetite is increased. Energy level is fair. Patient denies symptoms of anhedonia. Patient denies hopelessness, helplessness, or guilt.   He denies recent episodes consistent with mania, particularly decreased need for sleep with increased energy, grandiosity, impulsivity, hyper verbal and pressured spe316ech, and increased productivity. Denies any recent symptoms consistent with psychosis, particularly auditory or visual hallucinations, thought broadcasting/insertion/withdrawal, or ideas of reference. Also denies excessive worry to the point of physical symptoms as well as any panic attacks. Denies any history of trauma or symptoms consistent with PTSD such as flashbacks, nightmares, hypervigilance, feelings of numbness or inability to connect with others.   Review of  Systems  Constitutional: Positive for weight loss. Negative for fever and chills.  Respiratory: Positive for sputum production. Negative for cough, hemoptysis and shortness of breath.   Cardiovascular: Negative for chest pain, palpitations and leg swelling.  Gastrointestinal: Negative for nausea, vomiting, diarrhea and constipation.  Neurological: Negative for weakness.   Filed Vitals:   05/03/13 1048  Height: 5\' 11"  (1.803 m)  Weight: 316 lb (143.337 kg)    Physical Exam  Constitutional: He appears well-developed and well-nourished. No distress.  Skin: He is not diaphoretic.   Past Psychiatric History:  Reviewed Diagnosis: Bipolar II disorder, most recent episode hypomanic, Attention Deficit Hyperactivity Disorder   Hospitalizations: None   Outpatient Care: [facility]   Substance Abuse Care: Remote   Self-Mutilation:Patient denies   Suicidal Attempts: Patient denies   Violent Behaviors: extreme anger    Past Medical History: Reviewed  Past Medical History  Diagnosis Date  . Bipolar disorder   . Anxiety   . ADD (attention deficit disorder with hyperactivity)   . Hypertension    History of Loss of Consciousness: No  Seizure History: No  Cardiac History: No   Allergies: No Known Allergies   Current Medications:  History of Loss of Consciousness: No  Seizure History: No  Cardiac History: Yes-Aortic Stenosis   Allergies: No Known Allergies   Current Medications: Reviewed Current Outpatient Prescriptions on File Prior to Visit  Medication Sig Dispense Refill  . AMBULATORY NON FORMULARY MEDICATION Medication Name:Test strip to test 3 x a day. Dx 250.02. 90 days supply  200 Units  11  . BYDUREON 2 MG SUSR INJECT INTO SKIN ONCE A WEEK AS DIRECTED  4 each  2  . furosemide (LASIX) 20 MG tablet TAKE 1 TABLET (20 MG  TOTAL) BY MOUTH DAILY.  90 tablet  0  . glipiZIDE (GLUCOTROL XL) 5 MG 24 hr tablet TAKE 1 TABLET (5 MG TOTAL) BY MOUTH DAILY.  30 tablet  2  . insulin glargine  (LANTUS SOLOSTAR) 100 UNIT/ML injection Inject 0.1-0.15 mLs (10-15 Units total) into the skin at bedtime.  9 mL  1  . JANUMET XR [phone] MG TB24 TAKE 1 TABLET BY MOUTH DAILY.  90 tablet  0  . lisinopril (PRINIVIL,ZESTRIL) 40 MG tablet TAKE 1 TABLET (40 MG TOTAL) BY MOUTH DAILY.  90 tablet  1  . lithium carbonate 300 MG capsule Take one capsule (300 mg) in the morning and two capsules at bedtime.  90 capsule  1  . LOVAZA 1 G capsule TAKE 1 CAPSULE FOUR TIMES A DAY  360 capsule  2  . methylphenidate (CONCERTA) 36 MG CR tablet Take 1 tablet (36 mg total) by mouth every morning.  30 tablet  0  . ONE TOUCH ULTRA TEST test strip        No current facility-administered medications on file prior to visit.   Previous Psychotropic Medications: Reviewed  Medication  Dose   Desipramine  unknown   Nortriptyline  unknown   Wellbutrin  unknown    Substance Abuse History in the last 12 months: Reviewed  Caffeine: . Caffeine Beverages 0-16 ounces per day.  Nicotine: Patient denies.  Alcohol: Patient denies.  Illicit Drugs: Patient denies.   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  Hobbies/Interests: computers   Family History: Reviewed  Family History  Problem Relation Age of Onset  . ADD / ADHD Mother   . Hyperlipidemia Mother   . Hypertension Mother   . Dementia Mother   . ADD / ADHD Brother   . ADD / ADHD Son   . ADD / ADHD  Son   . ADD / ADHD Daughter   . Prostate cancer Father 13  . Alzheimer's disease Father   . Colon cancer Father   . Stomach cancer Father   . Heart attack Maternal Grandmother    Mental Status Examination/Evaluation:  Objective: Appearance: Casual   Eye Contact:: Good   Speech: Clear and Coherent and Normal Rate   Volume: Normal   Mood: "okay"  8/10  Affect: Appropriate   Thought Process: Circumstantial   Orientation: Full   Thought Content: WDL   Suicidal Thoughts: No   Homicidal Thoughts: No   Judgement: Fair   Insight: Good   Psychomotor Activity: Normal   Akathisia: No   Handed: Right   Memory: Immediate 3/3 recent-3/3  Memory: Intact  Assets: Communication Skills  Desire for Improvement  Financial Resources/Insurance  Housing  Social Support  Talents/Skills  Transportation  Vocational/Educational    Assessment:  AXIS I  Bipolar II disorder, most recent episode hypomanic, Attention Deficit Hyperactivity Disorder   AXIS II  No diagnosis   AXIS III  Aortic Stenosis, congenital   AXIS IV  economic problems, other psychosocial or environmental problems, problems related to  social environment and problems with primary support group   AXIS V  51-60 moderate symptoms    Treatment Plan/Recommendations:  1. Affirm with the patient that the medications are taken as ordered. Patient expressed understanding of how their medications were to be used.  2. Continue the following psychiatric medications as written prior to this appointment::  a) Continue  Lithium 300 mg- two tablets at bedtime, switch to long acting as patient wishes to take the medication once daily. Advised patient about the need for strict adherence to his diet as control of his DM is important in controlling his moods. b) Concerta 36 mg-one tablet daily. Patient recently filled prescription. 3. Therapy: brief supportive therapy provided. Discussed psychosocial stressors. Discussed importance of control of  diabetes for control of mood symptoms.  4. Risks and benefits, side effects and alternatives discussed with patient, he was given an opportunity to ask questions about his medication, illness, and treatment. All current psychiatric medications have been reviewed and discussed with the patient and adjusted as clinically appropriate. The patient has been provided an accurate and updated list of the medications being now prescribed.  5. Patient told to call clinic if any problems occur. Patient advised to go to ER if he should develop SI/HI, side effects, or if symptoms worsen. Has crisis numbers to call if needed.  6. Will order drug screen and continue random urine drug screens as long as patient is on concerta.  7. Reviewed labs.  8. The patient was encouraged to keep all PCP and specialty clinic appointments.  9. Patient was instructed to return to clinic in 2 months.  10. The patient expressed understanding of the plan outlined above and agrees with the plan.   Jacqulyn Cane, M.D.  05/03/2013 10:46 AM

## 2013-05-17 ENCOUNTER — Encounter (HOSPITAL_COMMUNITY): Payer: Self-pay | Admitting: Psychiatry

## 2013-05-17 ENCOUNTER — Telehealth (HOSPITAL_COMMUNITY): Payer: Self-pay

## 2013-05-17 ENCOUNTER — Ambulatory Visit (INDEPENDENT_AMBULATORY_CARE_PROVIDER_SITE_OTHER): Payer: PRIVATE HEALTH INSURANCE | Admitting: Psychiatry

## 2013-05-17 VITALS — BP 134/79 | HR 62 | Ht 71.0 in | Wt 313.0 lb

## 2013-05-17 DIAGNOSIS — F3189 Other bipolar disorder: Secondary | ICD-10-CM

## 2013-05-17 DIAGNOSIS — F909 Attention-deficit hyperactivity disorder, unspecified type: Secondary | ICD-10-CM

## 2013-05-17 DIAGNOSIS — F3181 Bipolar II disorder: Secondary | ICD-10-CM

## 2013-05-17 MED ORDER — LITHIUM CARBONATE 150 MG PO CAPS: 150.0000 mg | ORAL_CAPSULE | Freq: Every day | ORAL | Status: DC

## 2013-05-17 NOTE — Telephone Encounter (Signed)
Called patient. Left message stating I would call her back tomorrow. 

## 2013-05-17 NOTE — Telephone Encounter (Signed)
The patients that the patient has been getting angry with the slightest provocation. She has been helping him with his diet and remembering to take his medication.

## 2013-05-17 NOTE — Progress Notes (Signed)
[facility] Behavioral Health Follow-up Outpatient Visit  Cody Barnett [birth year]  Date: 05/17/2013  HPI Comments: SUBJECTIVE: [name] is a 52 year old male with a diagnosis of Attention Deficit Hyperactivity Disorder, Hyperactive type and Bipolar II Disorder, mixed.   The patient reports a worsening of his panic attacks.  He states that his panic attacks occur mainly at work.  He reports have more difficulty staying on task at work and with focus. He states he is having more irritability in the form of verbal aggressiveness but no physical aggression.  The patient states he has increased his Lantus to 20 units at bedtime and his fasting blood sugars are in the 170's range but has not spoken to his PCP about this ( I advised him to do so.)  He reports that he had blurted out some obscenities while at work and was called in to see his supervisor, but . The patient reports some increase in obsessions about things he is angry about.  He has made some positive changes including going to the gym.  In the area of affective symptoms, patient appears euthymic. Patient denies current suicidal ideation, intent, or plan. Patient denies current homicidal ideation, intent, or plan. Patient denies auditory hallucinations. Patient denies visual hallucinations. Patient denies symptoms of paranoia. Patient states sleep is fair, with approximately 7 hours of sleep per night.  Appetite is increased. Energy level is fair. Patient denies symptoms of anhedonia. Patient denies hopelessness, helplessness, or guilt.   He denies recent episodes consistent with mania, particularly decreased need for sleep with increased energy, grandiosity, impulsivity, hyper verbal and pressured speech, and increased productivity. Denies any recent symptoms consistent with psychosis, particularly auditory or visual hallucinations, thought broadcasting/insertion/withdrawal, or ideas of reference. Also denies excessive worry to the  point of physical symptoms as well as any panic attacks. Denies any history of trauma or symptoms consistent with PTSD such as flashbacks, nightmares, hypervigilance, feelings of numbness or inability to connect with others.   Collateral information from the patient's wife confirm the patient's moodiness and agitation   Review of Systems  Constitutional: Positive for weight loss. Negative for fever and chills.  Respiratory: Positive for sputum production. Negative for cough, hemoptysis and shortness of breath.   Cardiovascular: Negative for chest pain, palpitations and leg swelling.  Gastrointestinal: Negative for nausea, vomiting, diarrhea and constipation.  Neurological: Negative for weakness.   Filed Vitals:   05/17/13 1144  BP: 134/79  Pulse: 62  Height: 5\' 11"  (1.803 m)  Weight: 313 lb (141.976 kg)   Physical Exam  Constitutional: He appears well-developed and well-nourished. No distress.  Skin: He is not diaphoretic.  Musculoskeletal: Strength & Muscle Tone: within normal limits Gait & Station: normal Patient leans: N/A   Past Psychiatric History:  Reviewed Diagnosis: Bipolar II disorder, most recent episode hypomanic, Attention Deficit Hyperactivity Disorder   Hospitalizations: None   Outpatient Care: [facility]   Substance Abuse Care: Remote   Self-Mutilation:Patient denies   Suicidal Attempts: Patient denies   Violent Behaviors: extreme anger    Past Medical History: Reviewed  Past Medical History  Diagnosis Date  . Bipolar disorder   . Anxiety   . ADD (attention deficit disorder with hyperactivity)   . Hypertension    History of Loss of Consciousness: No  Seizure History: No  Cardiac History: No   Allergies: No Known Allergies   Current Medications:  History of Loss of Consciousness: No  Seizure History: No  Cardiac History: Yes-Aortic Stenosis  Allergies: No Known Allergies   Current Medications: Reviewed Current Outpatient Prescriptions on File Prior  to Visit  Medication Sig Dispense Refill  . AMBULATORY NON FORMULARY MEDICATION Medication Name:Test strip to test 3 x a day. Dx 250.02. 90 days supply  200 Units  11  . furosemide (LASIX) 20 MG tablet TAKE 1 TABLET (20 MG TOTAL) BY MOUTH DAILY.  90 tablet  0  . glipiZIDE (GLUCOTROL XL) 5 MG 24 hr tablet TAKE 1 TABLET (5 MG TOTAL) BY MOUTH DAILY.  30 tablet  2  . JANUMET XR [phone] MG TB24 TAKE 1 TABLET BY MOUTH DAILY.  90 tablet  0  . lisinopril (PRINIVIL,ZESTRIL) 40 MG tablet TAKE 1 TABLET (40 MG TOTAL) BY MOUTH DAILY.  90 tablet  1  . lithium carbonate (LITHOBID) 300 MG CR tablet Take 3 tablets (900 mg total) by mouth at bedtime.  90 tablet  1  . LOVAZA 1 G capsule TAKE 1 CAPSULE FOUR TIMES A DAY  360 capsule  2  . methylphenidate (CONCERTA) 36 MG CR tablet Take 1 tablet (36 mg total) by mouth every morning.  30 tablet  0  . ONE TOUCH ULTRA TEST test strip       . BYDUREON 2 MG SUSR INJECT INTO SKIN ONCE A WEEK AS DIRECTED  4 each  2   No current facility-administered medications on file prior to visit.   Previous Psychotropic Medications: Reviewed  Medication  Dose   Desipramine  unknown   Nortriptyline  unknown   Wellbutrin  unknown    Substance Abuse History in the last 12 months: Reviewed  Caffeine: . Caffeine Beverages 0-16 ounces per day.  Nicotine: Patient denies.  Alcohol: Patient denies.  Illicit Drugs: Patient denies.   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  Hobbies/Interests: computers   Family History: Reviewed  Family History  Problem Relation Age of Onset  . ADD / ADHD Mother   . Hyperlipidemia Mother   . Hypertension Mother   . Dementia Mother   . ADD / ADHD Brother   . ADD / ADHD Son   . ADD / ADHD Son   . ADD / ADHD Daughter   . Prostate cancer Father 34  . Alzheimer's disease Father   . Colon cancer Father   . Stomach cancer Father   . Heart attack Maternal Grandmother    Mental Status Examination/Evaluation:  Objective: Appearance: Casual   Eye Contact:: Good   Speech: Clear and Coherent and Normal Rate   Volume: Normal   Mood: "okay"  8/10  Affect: Appropriate   Thought Process: Circumstantial   Orientation: Full   Thought Content: WDL   Suicidal Thoughts: No   Homicidal Thoughts: No   Judgement: Fair   Insight: Good   Psychomotor Activity: Normal   Akathisia: No   Handed: Right   Memory: Immediate 3/3 recent-3/3  Memory: Intact  Assets: Communication Skills  Desire for Improvement  Financial Resources/Insurance  Housing  Social Support  Talents/Skills  Transportation  Vocational/Educational    Assessment:  AXIS I  Bipolar II disorder, most recent episode hypomanic, Attention Deficit Hyperactivity Disorder   AXIS II  No diagnosis   AXIS III  Aortic Stenosis, congenital   AXIS IV  economic problems, other psychosocial or environmental problems, problems related to social environment and problems with primary support group   AXIS V  51-60 moderate symptoms    Treatment Plan/Recommendations:  1. Affirm with the patient that the medications are taken as ordered. Patient expressed understanding of how their medications were to be used.  2. Continue the following psychiatric medications as written prior to this appointment::  a) Increase Lithium 300 mg- two tablets at bedtime (extended release) , with a 150 mg of immediate acting lithium. Again  advised patient about the need for strict adherence to his diet as control of his DM is important in controlling his moods. b) Concerta 36 mg-one tablet daily.  C) Will consider addition of depakote, geodon, or risperidone if mood symptoms cannot be controlled with Lithium alone. Geodon being the best option given that it is more weight neutral. 3. Therapy: brief supportive therapy provided. Discussed psychosocial stressors. Discussed importance of control of diabetes for control of mood symptoms.  4. Risks and benefits, side effects and alternatives discussed with patient, he was given an opportunity to ask questions about his medication, illness, and treatment. All current psychiatric medications have been reviewed and discussed with the patient and adjusted as clinically appropriate. The patient has been provided an accurate and updated list of the medications being now prescribed.  5. Patient told to call clinic if any problems occur. Patient advised to go to ER if he should develop SI/HI, side effects, or if symptoms worsen. Has crisis numbers to call if needed.  6. Will order drug screen and continue random urine drug screens as long as patient is on concerta.  7. Reviewed labs.  8. The patient was encouraged to keep all PCP and specialty clinic appointments.  9. Patient was instructed to return to clinic in 1 month.  10. The patient expressed understanding of the plan outlined above and agrees with the plan.   Jacqulyn Cane, M.D.  05/17/2013 11:43 AM

## 2013-05-22 ENCOUNTER — Encounter: Payer: Self-pay | Admitting: Family Medicine

## 2013-05-22 ENCOUNTER — Ambulatory Visit (INDEPENDENT_AMBULATORY_CARE_PROVIDER_SITE_OTHER): Payer: PRIVATE HEALTH INSURANCE | Admitting: Family Medicine

## 2013-05-22 VITALS — BP 126/68 | HR 72 | Ht 71.0 in | Wt 313.0 lb

## 2013-05-22 DIAGNOSIS — E119 Type 2 diabetes mellitus without complications: Secondary | ICD-10-CM

## 2013-05-22 DIAGNOSIS — E785 Hyperlipidemia, unspecified: Secondary | ICD-10-CM

## 2013-05-22 DIAGNOSIS — I10 Essential (primary) hypertension: Secondary | ICD-10-CM

## 2013-05-22 MED ORDER — PRAVASTATIN SODIUM 40 MG PO TABS: 40.0000 mg | ORAL_TABLET | Freq: Every day | ORAL | Status: DC

## 2013-05-22 NOTE — Progress Notes (Addendum)
  Subjective:    Patient ID: Cody Barnett, male    DOB: 10-23-1961, 52 y.o.   MRN: [id number]  HPI DM- Sugar this Am was 137. Has been running in the 150s.  Stopped lantus at 20 units. Has been working with nurse at work and going over sugar logs. No hypoglycemic events.  Has noticed some tingling and numbness in his toes.  Skpped the bydureon in the last 3 weeks says thinks that this might affect his A1c for today.  Lab Results  Component Value Date   HGBA1C 7.9 04/10/2013     HTN-  Pt denies chest pain, SOB, dizziness, or heart palpitations.  Taking meds as directed w/o problems.  Denies medication side effects.   Hyperlipidemia-has had memory issues with statins in the past. He is open to trying one. He did not have any problems with myalgias. He is trying to be more healthy and lose weight. No regular exercise right now.  Review of Systems     Objective:   Physical Exam  Constitutional: He is oriented to person, place, and time. He appears well-developed and well-nourished.  HENT:  Head: Normocephalic and atraumatic.  Cardiovascular: Normal rate, regular rhythm and normal heart sounds.   Pulmonary/Chest: Effort normal and breath sounds normal.  Neurological: He is alert and oriented to person, place, and time.  Skin: Skin is warm and dry.  Psychiatric: He has a normal mood and affect. His behavior is normal.          Assessment & Plan:  DM- Well controlled.  discussed need for statin based on guidelines.  F/U in 3 months.  Has been exercising every days. Increase lantus to 22 units. Continue Bydureon. Make sure using it consistently. Keep up the exercise. And hopefully he will be at goal in 3 months. Lab Results  Component Value Date   HGBA1C 7.9 04/10/2013     HTN - Well controlled.  F/U in 3 months.   Hyperlipidemia - discussed need for statin. He says she is willing to retry it.  Will start pravastatin. He says he feels like he's had some memory issues on  several statins in the past but says he is willing to try again. Lab Results  Component Value Date   CHOL 194 04/04/2013   HDL 39* 04/04/2013   LDLCALC 116* 04/04/2013   LDLDIRECT 111* 09/13/2012   TRIG 197* 04/04/2013   CHOLHDL 5.0 04/04/2013

## 2013-05-31 ENCOUNTER — Ambulatory Visit (HOSPITAL_COMMUNITY): Payer: Self-pay | Admitting: Psychiatry

## 2013-06-03 ENCOUNTER — Other Ambulatory Visit: Payer: Self-pay | Admitting: Family Medicine

## 2013-06-05 ENCOUNTER — Telehealth (HOSPITAL_COMMUNITY): Payer: Self-pay

## 2013-06-05 DIAGNOSIS — F3181 Bipolar II disorder: Secondary | ICD-10-CM

## 2013-06-05 DIAGNOSIS — F909 Attention-deficit hyperactivity disorder, unspecified type: Secondary | ICD-10-CM

## 2013-06-05 NOTE — Telephone Encounter (Signed)
NEEDS REFILL OF METHLEPHENIDATE AND RX OF LITHIUM

## 2013-06-06 MED ORDER — LITHIUM CARBONATE ER 300 MG PO TBCR: 900.0000 mg | EXTENDED_RELEASE_TABLET | Freq: Every day | ORAL | Status: DC

## 2013-06-06 MED ORDER — METHYLPHENIDATE HCL ER (OSM) 36 MG PO TBCR: 36.0000 mg | EXTENDED_RELEASE_TABLET | ORAL | Status: DC

## 2013-06-06 NOTE — Addendum Note (Signed)
Addended by: Larena Sox on: 06/06/2013 12:52 PM   Modules accepted: Orders

## 2013-06-06 NOTE — Telephone Encounter (Addendum)
Will provide refill for Lithium and methylphenidate. He has a refill for lithium but will send in a 30 day supply with a refill.

## 2013-06-10 ENCOUNTER — Other Ambulatory Visit: Payer: Self-pay | Admitting: Family Medicine

## 2013-06-11 ENCOUNTER — Other Ambulatory Visit: Payer: Self-pay | Admitting: Family Medicine

## 2013-06-29 ENCOUNTER — Other Ambulatory Visit: Payer: Self-pay

## 2013-07-19 ENCOUNTER — Other Ambulatory Visit: Payer: Self-pay | Admitting: Family Medicine

## 2013-08-02 ENCOUNTER — Ambulatory Visit (INDEPENDENT_AMBULATORY_CARE_PROVIDER_SITE_OTHER): Payer: PRIVATE HEALTH INSURANCE | Admitting: Psychiatry

## 2013-08-02 ENCOUNTER — Encounter (HOSPITAL_COMMUNITY): Payer: Self-pay | Admitting: Psychiatry

## 2013-08-02 VITALS — BP 149/92 | HR 84 | Ht 71.0 in | Wt 323.0 lb

## 2013-08-02 DIAGNOSIS — F909 Attention-deficit hyperactivity disorder, unspecified type: Secondary | ICD-10-CM

## 2013-08-02 DIAGNOSIS — F3181 Bipolar II disorder: Secondary | ICD-10-CM

## 2013-08-02 DIAGNOSIS — F3189 Other bipolar disorder: Secondary | ICD-10-CM

## 2013-08-02 MED ORDER — LITHIUM CARBONATE ER 300 MG PO TBCR: 600.0000 mg | EXTENDED_RELEASE_TABLET | Freq: Two times a day (BID) | ORAL | Status: DC

## 2013-08-02 NOTE — Progress Notes (Signed)
[facility] Behavioral Health Follow-up Outpatient Visit  [address] August 03, 1961  Date: 08/02/2013  HPI Comments: SUBJECTIVE: [name] is a 52 year old male with a diagnosis of Attention Deficit Hyperactivity Disorder, Hyperactive type and Bipolar II Disorder, mixed.   The patient reports that he has been having anxiety which has been worsening.  He reports that he has no specific trigger for anxiety but that when he is anxious, most everything will cause him to get anxious and he will get upset.  He has been taking 1200 mg of Lithium for the past two weeks.  He states he also started to take trileptal due to foot pain. He denies any other complaints.   In the area of affective symptoms, patient appears euthymic. Patient denies current suicidal ideation, intent, or plan. Patient denies current homicidal ideation, intent, or plan. Patient denies auditory hallucinations. Patient denies visual hallucinations. Patient denies symptoms of paranoia. Patient states sleep is fair, with approximately 7 hours of sleep per night.  Appetite is still increased. Energy level has been improved. Patient denies symptoms of anhedonia. Patient denies hopelessness, helplessness, or guilt.   He denies recent episodes consistent with mania, particularly decreased need for sleep with increased energy, grandiosity, impulsivity, hyper verbal and pressured speech, and increased productivity. Denies any recent symptoms consistent with psychosis, particularly auditory or visual hallucinations, thought broadcasting/insertion/withdrawal, or ideas of reference. Also denies excessive worry to the point of physical symptoms as well as any panic attacks. Denies any history of trauma or symptoms consistent with PTSD such as flashbacks, nightmares, hypervigilance, feelings of numbness or inability to connect with others.    Review of Systems  Constitutional: Positive for weight loss. Negative for fever and chills.   Respiratory: Positive for sputum production. Negative for cough, hemoptysis and shortness of breath.   Cardiovascular: Negative for chest pain, palpitations and leg swelling.  Gastrointestinal: Negative for nausea, vomiting, diarrhea and constipation.  Neurological: Negative for weakness.   Filed Vitals:   08/02/13 1024  BP: 149/92  Pulse: 84  Height: 5\' 11"  (1.803 m)  Weight: 323 lb (146.512 kg)   Physical Exam  Constitutional: He appears well-developed and well-nourished. No distress.  Skin: He is not diaphoretic.  Neurological: No tremor Musculoskeletal: Strength & Muscle Tone: within normal limits Gait & Station: normal Patient leans: N/A   Past Psychiatric History:  Reviewed Diagnosis: Bipolar II disorder, most recent episode hypomanic, Attention Deficit Hyperactivity Disorder   Hospitalizations: None   Outpatient Care: [facility]   Substance Abuse Care: Remote   Self-Mutilation:Patient denies   Suicidal Attempts: Patient denies   Violent Behaviors: extreme anger    Past Medical History: Reviewed  Past Medical History  Diagnosis Date  . Bipolar disorder   . Anxiety   . ADD (attention deficit disorder with hyperactivity)   . Hypertension    History of Loss of Consciousness: No  Seizure History: No  Cardiac History: No   Allergies: No Known Allergies   Current Medications:  History of Loss of Consciousness: No  Seizure History: No  Cardiac History: Yes-Aortic Stenosis   Allergies: No Known Allergies   Current Medications: Reviewed Current Outpatient Prescriptions on File Prior to Visit  Medication Sig Dispense Refill  . AMBULATORY NON FORMULARY MEDICATION Medication Name:Test strip to test 3 x a day. Dx 250.02. 90 days supply  200 Units  11  . BYDUREON 2 MG SUSR INJECT INTO SKIN ONCE A WEEK AS DIRECTED  1 each  2  . furosemide (LASIX)  20 MG tablet TAKE 1 TABLET (20 MG TOTAL) BY MOUTH DAILY.  90 tablet  0  . glipiZIDE (GLUCOTROL XL) 5 MG 24 hr tablet TAKE  1 TABLET (5 MG TOTAL) BY MOUTH DAILY.  30 tablet  2  . insulin glargine (LANTUS) 100 UNIT/ML injection Inject 22 Units into the skin at bedtime.       Marland Kitchen JANUMET XR [phone] MG TB24 TAKE 1 TABLET BY MOUTH DAILY.  90 tablet  0  . LANTUS SOLOSTAR 100 UNIT/ML SOPN INJECT 0.1-0.15 MLS (10-15 UNITS TOTAL) INTO THE SKIN AT BEDTIME.  9 pen  1  . lisinopril (PRINIVIL,ZESTRIL) 40 MG tablet TAKE 1 TABLET (40 MG TOTAL) BY MOUTH DAILY.  90 tablet  1  . lithium carbonate (LITHOBID) 300 MG CR tablet Take 3 tablets (900 mg total) by mouth at bedtime.  90 tablet  1  . LOVAZA 1 G capsule TAKE 1 CAPSULE FOUR TIMES A DAY  360 capsule  2  . methylphenidate (CONCERTA) 36 MG CR tablet Take 1 tablet (36 mg total) by mouth every morning.  30 tablet  0  . ONE TOUCH ULTRA TEST test strip       . pravastatin (PRAVACHOL) 40 MG tablet Take 1 tablet (40 mg total) by mouth at bedtime.  30 tablet  2   No current facility-administered medications on file prior to visit.   Previous Psychotropic Medications: Reviewed  Medication  Dose   Desipramine  unknown   Nortriptyline  unknown   Wellbutrin  unknown    Substance Abuse History in the last 12 months: Reviewed  Caffeine: . Caffeine Beverages 0-16 ounces per day.  Nicotine: Patient denies.  Alcohol: Patient denies.  Illicit Drugs: Patient denies.   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  Hobbies/Interests:  computers   Family History: Reviewed  Family History  Problem Relation Age of Onset  . ADD / ADHD Mother   . Hyperlipidemia Mother   . Hypertension Mother   . Dementia Mother   . ADD / ADHD Brother   . ADD / ADHD Son   . ADD / ADHD Son   . ADD / ADHD Daughter   . Prostate cancer Father 44  . Alzheimer's disease Father   . Colon cancer Father   . Stomach cancer Father   . Heart attack Maternal Grandmother    Psychiatric Specialty Exam: Objective: Appearance: Casual   Eye Contact:: Good   Speech: Clear and Coherent and Normal Rate   Volume: Normal   Mood: "it's okay"  7/10  (0=Very depressed; 5=Neutral; 10=Very Happy)   Affect: Appropriate   Thought Process: Circumstantial   Orientation: Full   Thought Content: WDL   Suicidal Thoughts: No   Homicidal Thoughts: No   Judgement: Fair   Insight: Good   Psychomotor Activity: Normal   Akathisia: No   Handed: Right   Memory: Immediate 3/3 recent-3/3  Memory: Intact  Assets: Communication Skills  Desire for Improvement  Financial Resources/Insurance  Housing  Social Support  Talents/Skills  Transportation  Vocational/Educational    Assessment:  AXIS I  Bipolar II disorder, most recent episode hypomanic, Attention Deficit Hyperactivity Disorder   AXIS II  No diagnosis   AXIS III  Aortic Stenosis, congenital   AXIS IV  economic problems, other psychosocial or environmental problems, problems related to social environment and problems with primary support group   AXIS V  51-60 moderate symptoms    Treatment Plan/Recommendations:  1. Affirm with the patient that the medications are taken as ordered. Patient expressed understanding of how their medications were to be used.  2. Continue the following psychiatric medications as written prior to this appointment::  a) Increase Lithium 1200 mg- two tablets at bedtime (extended release) , with a 150 mg of immediate acting lithium. Will consider further titration as needed. Again  advised patient about the need for strict adherence to his diet as control of his DM is important in controlling his moods. b) Concerta 36 mg-one tablet daily.  C) Will consider addition of depakote, geodon, or risperidone if mood symptoms cannot be controlled with Lithium alone. Geodon being the best option given that it is more weight neutral. 3. Therapy: brief supportive therapy provided. Discussed psychosocial stressors. Discussed importance of control of diabetes for control of mood symptoms.  4. Risks and benefits, side effects and alternatives discussed with patient, he was given an opportunity to ask questions about his medication, illness, and treatment. All current psychiatric medications have been reviewed and discussed with the patient and adjusted as clinically appropriate. The patient has been provided an accurate and updated list of the medications being now prescribed.  5. Patient told to call clinic if any problems occur. Patient advised to go to ER if he should develop SI/HI, side effects, or if symptoms worsen. Has crisis numbers to call if needed.  6. Will order drug screen and continue random urine drug screens as long as patient is on concerta.  7. Reviewed labs. Will order repeat TSH, lithium level, BUN, and creatinine. 8. The patient was encouraged to keep all PCP and specialty clinic appointments.  9. Patient was instructed to return to clinic in 1 month.  10. The patient expressed understanding of the plan outlined above and agrees with the plan.   Jacqulyn Cane, M.D.  08/02/2013 10:35 AM

## 2013-08-03 ENCOUNTER — Encounter: Payer: Self-pay | Admitting: Family Medicine

## 2013-08-03 ENCOUNTER — Ambulatory Visit (INDEPENDENT_AMBULATORY_CARE_PROVIDER_SITE_OTHER): Payer: PRIVATE HEALTH INSURANCE | Admitting: Family Medicine

## 2013-08-03 VITALS — BP 152/75 | HR 76 | Ht 71.0 in | Wt 319.0 lb

## 2013-08-03 DIAGNOSIS — IMO0001 Reserved for inherently not codable concepts without codable children: Secondary | ICD-10-CM

## 2013-08-03 DIAGNOSIS — F3189 Other bipolar disorder: Secondary | ICD-10-CM

## 2013-08-03 DIAGNOSIS — M722 Plantar fascial fibromatosis: Secondary | ICD-10-CM

## 2013-08-03 DIAGNOSIS — F3181 Bipolar II disorder: Secondary | ICD-10-CM

## 2013-08-03 NOTE — Patient Instructions (Signed)

## 2013-08-03 NOTE — Progress Notes (Signed)
Subjective:    Patient ID: Cody Barnett, male    DOB: [birth year], 52 y.o.   MRN: [id number]  HPI Left Heel pain x 1.5 mo.  No trauma. Painful to walk on.  Pain has been better in the last 3 days.  Gets stabbing pain in the center of the heel.  Has affected his ability to exercise.  Then get a pulling sensation from the ball fo the foot through the arch.  Then gets pin-prick sensation across the top of the toes.   The pin-prick snesation is better.  Has been using IBU and tylenol.    Anxiety - Says not doing well.  He is currently on Lithium but has been taking some trileptal.  He started the Trileptal again about 3 days ago. Since doing that he notices that his irritability has calmed down significantly. He feels that this is actually medium much better combination. He still had an old prescription from her previous provider.   Review of Systems  BP 152/75  Pulse 76  Ht 5\' 11"  (1.803 m)  Wt 319 lb (144.697 kg)  BMI 44.51 kg/m2    Allergies  Allergen Reactions  . Lipitor [Atorvastatin] Other (See Comments)    Memory problems    Past Medical History  Diagnosis Date  . Bipolar disorder   . Anxiety   . ADD (attention deficit disorder with hyperactivity)   . Hypertension     Past Surgical History  Procedure Laterality Date  . Back surgery      Broken back in two places in1980  . Kidneystones removed      History   Social History  . Marital Status: Married    Spouse Name: N/A    Number of Children: N/A  . Years of Education: N/A   Occupational History  . Not on file.   Social History Main Topics  . Smoking status: Former Smoker    Quit date: 11/22/2001  . Smokeless tobacco: Never Used  . Alcohol Use: No  . Drug Use: No  . Sexual Activity: Yes    Partners: Female   Other Topics Concern  . Not on file   Social History Narrative  . No narrative on file    Family History  Problem Relation Age of Onset  . ADD / ADHD Mother   . Hyperlipidemia Mother    . Hypertension Mother   . Dementia Mother   . ADD / ADHD Brother   . ADD / ADHD Son   . ADD / ADHD Son   . ADD / ADHD Daughter   . Prostate cancer Father 64  . Alzheimer's disease Father   . Colon cancer Father   . Stomach cancer Father   . Heart attack Maternal Grandmother     Outpatient Encounter Prescriptions as of 08/03/2013  Medication Sig Dispense Refill  . AMBULATORY NON FORMULARY MEDICATION Medication Name:Test strip to test 3 x a day. Dx 250.02. 90 days supply  200 Units  11  . BYDUREON 2 MG SUSR INJECT INTO SKIN ONCE A WEEK AS DIRECTED  1 each  2  . furosemide (LASIX) 20 MG tablet TAKE 1 TABLET (20 MG TOTAL) BY MOUTH DAILY.  90 tablet  0  . glipiZIDE (GLUCOTROL XL) 5 MG 24 hr tablet TAKE 1 TABLET (5 MG TOTAL) BY MOUTH DAILY.  30 tablet  2  . JANUMET XR [phone] MG TB24 TAKE 1 TABLET BY MOUTH DAILY.  90 tablet  0  . LANTUS SOLOSTAR 100  UNIT/ML SOPN INJECT 0.1-0.15 MLS (10-15 UNITS TOTAL) INTO THE SKIN AT BEDTIME.  9 pen  1  . lisinopril (PRINIVIL,ZESTRIL) 40 MG tablet TAKE 1 TABLET (40 MG TOTAL) BY MOUTH DAILY.  90 tablet  1  . lithium carbonate (LITHOBID) 300 MG CR tablet Take 2 tablets (600 mg total) by mouth 2 (two) times daily.  120 tablet  1  . LOVAZA 1 G capsule TAKE 1 CAPSULE FOUR TIMES A DAY  360 capsule  2  . methylphenidate (CONCERTA) 36 MG CR tablet Take 1 tablet (36 mg total) by mouth every morning.  30 tablet  0  . ONE TOUCH ULTRA TEST test strip       . pravastatin (PRAVACHOL) 40 MG tablet Take 1 tablet (40 mg total) by mouth at bedtime.  30 tablet  2  . [DISCONTINUED] insulin glargine (LANTUS) 100 UNIT/ML injection Inject 22 Units into the skin at bedtime.        No facility-administered encounter medications on file as of 08/03/2013.          Objective:   Physical Exam  Constitutional: He is oriented to person, place, and time. He appears well-developed and well-nourished.  HENT:  Head: Normocephalic and atraumatic.  Musculoskeletal:  NROM of  the left ankle. Strength is 5/5 in all ROM. DP pulse 2+ on the left foot.  nontender over the heel.  No pain with dorsiflexion.  nontender over the arch.    Neurological: He is alert and oriented to person, place, and time.  Skin: Skin is warm and dry.  Skin on hand is dry and white.   Psychiatric: He has a normal mood and affect. His behavior is normal.          Assessment & Plan:  Plantar fascitis - new problem. Discussed dx. H.O given on home stretches and exercises today.. Cold icy massage. Anti-inflammatories as needed. He says it has actually gotten better for the last 3 days. Suddenly gets worse her symptoms still remain did encourage him to follow my partner Dr. Rodney Langton for possible injections and splinting. No findings that are concerning that require a x-ray today.  Anxiety/bipolar disorder - Speak with psych about the Trileptal.  They may need to adjust his medications if he feels like the Trileptal is working better than the lithium. They're both anti-seizure or drug so he does need to be careful and I encouraged him to call a psychiatrist so that they know what he is doing. It sounds like yard he has a followup in about 2-3 weeks which is perfect.  Diabetes-followup in about 2-3 weeks. He wanted to go ahead and have a lab slip today so he can get this drawn at work as he can get the labs done there for free. Given a prescription for BMP and a hemoglobin A1c.

## 2013-08-07 ENCOUNTER — Telehealth (HOSPITAL_COMMUNITY): Payer: Self-pay

## 2013-08-07 DIAGNOSIS — F3181 Bipolar II disorder: Secondary | ICD-10-CM

## 2013-08-07 DIAGNOSIS — F909 Attention-deficit hyperactivity disorder, unspecified type: Secondary | ICD-10-CM

## 2013-08-07 MED ORDER — METHYLPHENIDATE HCL ER (OSM) 36 MG PO TBCR: 36.0000 mg | EXTENDED_RELEASE_TABLET | ORAL | Status: DC

## 2013-08-07 NOTE — Telephone Encounter (Signed)
Refill request for concerta. Will provide the same.

## 2013-08-10 LAB — BASIC METABOLIC PANEL
BUN: 14 mg/dL (ref 4–21)
Creatinine: 1.1 mg/dL (ref 0.6–1.3)
Glucose: 134 mg/dL

## 2013-08-10 LAB — HEMOGLOBIN A1C: Hgb A1c MFr Bld: 8.5 % — AB (ref 4.0–6.0)

## 2013-08-10 LAB — HEPATIC FUNCTION PANEL: Alkaline Phosphatase: 88 U/L (ref 25–125)

## 2013-08-14 ENCOUNTER — Telehealth: Payer: Self-pay | Admitting: Family Medicine

## 2013-08-14 NOTE — Telephone Encounter (Signed)
Call pt: Got copy of labs .  Thyroid is off.  Maybe hypothyroid. Recommend repeat ot confirm before we consider tx.

## 2013-08-15 NOTE — Telephone Encounter (Signed)
Left detailed msg informing pt of recommendations.Heath Gold'

## 2013-08-16 ENCOUNTER — Ambulatory Visit (HOSPITAL_COMMUNITY): Payer: Self-pay | Admitting: Psychiatry

## 2013-08-17 ENCOUNTER — Encounter: Payer: Self-pay | Admitting: *Deleted

## 2013-08-18 ENCOUNTER — Ambulatory Visit (INDEPENDENT_AMBULATORY_CARE_PROVIDER_SITE_OTHER): Payer: PRIVATE HEALTH INSURANCE | Admitting: Psychiatry

## 2013-08-18 ENCOUNTER — Encounter (HOSPITAL_COMMUNITY): Payer: Self-pay | Admitting: Psychiatry

## 2013-08-18 VITALS — BP 149/69 | HR 82 | Ht 71.0 in | Wt 322.0 lb

## 2013-08-18 DIAGNOSIS — F3189 Other bipolar disorder: Secondary | ICD-10-CM

## 2013-08-18 DIAGNOSIS — F3181 Bipolar II disorder: Secondary | ICD-10-CM

## 2013-08-18 DIAGNOSIS — F909 Attention-deficit hyperactivity disorder, unspecified type: Secondary | ICD-10-CM

## 2013-08-18 MED ORDER — OXCARBAZEPINE 600 MG PO TABS: 900.0000 mg | ORAL_TABLET | Freq: Two times a day (BID) | ORAL | Status: DC

## 2013-08-18 NOTE — Progress Notes (Signed)
[facility] Follow-up Outpatient Visit  Cody Barnett 1961-09-16  Date: 08/18/2013  HPI Comments: SUBJECTIVE: [name] is a 52 year old male with a diagnosis of Attention Deficit Hyperactivity Disorder, Hyperactive type and Bipolar II Disorder, mixed.   The patient reports that he has been having anxiety which has been worsening.  He reports that he has no specific trigger for anxiety but that when he is anxious, most everything will cause him to get anxious and he will get upset.  He has been taking 1200 mg of Lithium for the past two weeks.  He states he also started to take trileptal due to foot pain but now states that it has improved his mood as well and would like to restart the medication. The patient reports he is taking 1200 mg of trileptal and 1200 of Lithium. He denies any other complaints.   In the area of affective symptoms, patient appears euthymic. Patient denies current suicidal ideation, intent, or plan. Patient denies current homicidal ideation, intent, or plan. Patient denies auditory hallucinations. Patient denies visual hallucinations. Patient denies symptoms of paranoia. Patient states sleep is fair, with approximately 7 hours of sleep per night.  Appetite is still increased. Energy level has been improved. Patient denies symptoms of anhedonia. Patient denies hopelessness, helplessness, but endorses guilt about how he raised his older.   He denies recent episodes consistent with mania, particularly decreased need for sleep with increased energy, grandiosity, impulsivity, hyper verbal and pressured speech, and increased productivity. Denies any recent symptoms consistent with psychosis, particularly auditory or visual hallucinations, thought broadcasting/insertion/withdrawal, or ideas of reference. Also denies excessive worry to the point of physical symptoms as well as any panic attacks. Denies any history of trauma or symptoms consistent with PTSD  such as flashbacks, nightmares, hypervigilance, feelings of numbness or inability to connect with others.   The patient's wife is here for collateral information. She reports that the patient has underreported his caffeine intake as well as not reporting several stressors, specifically related to his adult son who has substance abuse issues. She relates that the patient is particularly hard on their younger son (now a teenager) most likely for fear of him falling into drug use as well.    Review of Systems  Constitutional: Positive for weight loss. Negative for fever and chills.  Respiratory: Positive for sputum production. Negative for cough, hemoptysis and shortness of breath.   Cardiovascular: Negative for chest pain, palpitations and leg swelling.  Gastrointestinal: Negative for nausea, vomiting, diarrhea and constipation.  Neurological: Negative for weakness.   Filed Vitals:   08/18/13 1310  BP: 149/69  Pulse: 82  Height: 5\' 11"  (1.803 m)  Weight: 322 lb (146.058 kg)    Physical Exam  Constitutional: He appears well-developed and well-nourished. No distress.  Skin: He is not diaphoretic.  Neurological: No tremor Musculoskeletal: Strength & Muscle Tone: within normal limits Gait & Station: normal Patient leans: N/A   Past Psychiatric History:  Reviewed Diagnosis: Bipolar II disorder, most recent episode hypomanic, Attention Deficit Hyperactivity Disorder   Hospitalizations: None   Outpatient Care: [facility]   Substance Abuse Care: Remote   Self-Mutilation:Patient denies   Suicidal Attempts: Patient denies   Violent Behaviors: extreme anger    Past Medical History: Reviewed  Past Medical History  Diagnosis Date  . Bipolar disorder   . Anxiety   . ADD (attention deficit disorder with hyperactivity)   . Hypertension    History of Loss of Consciousness: No  Seizure  History: No  Cardiac History: No   Allergies: No Known Allergies   Current Medications:  History of Loss  of Consciousness: No  Seizure History: No  Cardiac History: Yes-Aortic Stenosis   Allergies: No Known Allergies   Current Medications: Reviewed Current Outpatient Prescriptions on File Prior to Visit  Medication Sig Dispense Refill  . AMBULATORY NON FORMULARY MEDICATION Medication Name:Test strip to test 3 x a day. Dx 250.02. 90 days supply  200 Units  11  . BYDUREON 2 MG SUSR INJECT INTO SKIN ONCE A WEEK AS DIRECTED  1 each  2  . furosemide (LASIX) 20 MG tablet TAKE 1 TABLET (20 MG TOTAL) BY MOUTH DAILY.  90 tablet  0  . glipiZIDE (GLUCOTROL XL) 5 MG 24 hr tablet TAKE 1 TABLET (5 MG TOTAL) BY MOUTH DAILY.  30 tablet  2  . JANUMET XR [phone] MG TB24 TAKE 1 TABLET BY MOUTH DAILY.  90 tablet  0  . LANTUS SOLOSTAR 100 UNIT/ML SOPN INJECT 0.1-0.15 MLS (10-15 UNITS TOTAL) INTO THE SKIN AT BEDTIME.  9 pen  1  . lisinopril (PRINIVIL,ZESTRIL) 40 MG tablet TAKE 1 TABLET (40 MG TOTAL) BY MOUTH DAILY.  90 tablet  1  . lithium carbonate (LITHOBID) 300 MG CR tablet Take 2 tablets (600 mg total) by mouth 2 (two) times daily.  120 tablet  1  . LOVAZA 1 G capsule TAKE 1 CAPSULE FOUR TIMES A DAY  360 capsule  2  . methylphenidate (CONCERTA) 36 MG CR tablet Take 1 tablet (36 mg total) by mouth every morning.  30 tablet  0  . ONE TOUCH ULTRA TEST test strip       . pravastatin (PRAVACHOL) 40 MG tablet Take 1 tablet (40 mg total) by mouth at bedtime.  30 tablet  2   No current facility-administered medications on file prior to visit.   Previous Psychotropic Medications: Reviewed  Medication  Dose   Desipramine  unknown   Nortriptyline  unknown   Wellbutrin  unknown    Substance Abuse History in the last 12 months: Reviewed  Caffeine: . Caffeine Beverages 108 ounces per day.  Nicotine: Patient denies.  Alcohol: Patient denies.  Illicit Drugs: Patient denies.   Blackouts: No  DT's: No  Withdrawal Symptoms: No   Social History: Reviewed  Current Place of Residence:Place of Birth: [name]  Family Members: 4  Marital Status: Married  Children: 2  Sons: 2  Daughters: 1  Relationships: ex-wife and older son, never treated for ADHD  Education: unknown  Educational Problems/Performance: affected by ADHD, hyperactive, treated at [facility]  Religious Beliefs/Practices: Former Banker; [employer]  History of Abuse: unknown  Occupational Experiences: Clinical biochemist. Military History: 6 month-medical discharge  Legal History: Patient denies.  Hobbies/Interests: computers   Family History: Reviewed  Family History  Problem Relation Age of Onset  . ADD / ADHD Mother   . Hyperlipidemia Mother   . Hypertension Mother   . Dementia Mother   . ADD / ADHD Brother   . ADD / ADHD Son   . ADD / ADHD Son   . ADD / ADHD Daughter   . Prostate cancer Father 44  . Alzheimer's disease Father   . Colon cancer Father   . Stomach cancer Father   . Heart attack Maternal Grandmother    Psychiatric Specialty Exam: Objective: Appearance: Casual   Eye Contact:: Good   Speech: Clear and Coherent and Normal Rate   Volume: Normal  Mood: "pretty settled, a little anxious"  7/10  (0=Very depressed; 5=Neutral; 10=Very Happy) Anxiety-2/10(0=no anxiety; 5=moderate Anxious; 10+panic attacks)  Affect: Appropriate   Thought Process: Circumstantial   Orientation: Full   Thought Content: WDL   Suicidal Thoughts: No   Homicidal Thoughts: No   Judgement: Fair   Insight: Good   Psychomotor Activity: Normal   Akathisia: No   Handed: Right   Memory: Immediate 3/3 recent-3/3  Memory: Intact  Assets: Communication Skills  Desire for Improvement  Financial Resources/Insurance  Housing  Social Support  Talents/Skills  Transportation  Vocational/Educational    Assessment:  AXIS I  Bipolar II disorder, most recent episode hypomanic, Attention Deficit Hyperactivity Disorder   AXIS II  No diagnosis   AXIS III  Aortic Stenosis, congenital   AXIS IV  economic problems, other  psychosocial or environmental problems, problems related to social environment and problems with primary support group   AXIS V  51-60 moderate symptoms    Treatment Plan/Recommendations:  1. Affirm with the patient that the medications are taken as ordered. Patient expressed understanding of how their medications were to be used.  2. Continue the following psychiatric medications as written prior to this appointment::  a) Continue Lithium 1200 mg- two tablets at bedtime (extended release) ,  Again advised patient about the need for strict adherence to his diet as control of his DM is important in controlling his moods. b) Concerta 36 mg-one tablet daily.  C) Continue Trileptal 1200 mg as the patient and his wife reports that the patient does well with Trileptal. d) Will consider addition of depakote, geodon, or risperidone if mood symptoms cannot be controlled with Lithium alone. Geodon being the best option given that it is more weight neutral. 3. Therapy: brief supportive therapy provided. Discussed psychosocial stressors. Discussed importance of control of diabetes for control of mood symptoms.  4. Risks and benefits, side effects and alternatives discussed with patient, he was given an opportunity to ask questions about his medication, illness, and treatment. All current psychiatric medications have been reviewed and discussed with the patient and adjusted as clinically appropriate. The patient has been provided an accurate and updated list of the medications being now prescribed.  5. Patient told to call clinic if any problems occur. Patient advised to go to ER if he should develop SI/HI, side effects, or if symptoms worsen. Has crisis numbers to call if needed.  6. Will order drug screen and continue random urine drug screens as long as patient is on concerta.  7. Reviewed labs. Will order repeat Thyroid panel. LAst TSH was 12. 8. The patient was encouraged to keep all PCP and specialty clinic  appointments.  9. Patient was instructed to return to clinic in 1 month.  10. The patient expressed understanding of the plan outlined above and agrees with the plan.   Jacqulyn Cane, M.D.  08/18/2013 1:04 PM

## 2013-08-19 LAB — LITHIUM LEVEL: Lithium Lvl: 0.6 mEq/L — ABNORMAL LOW (ref 0.80–1.40)

## 2013-08-19 LAB — DRUG SCREEN, URINE
Barbiturate Quant, Ur: NEGATIVE
Cocaine Metabolites: NEGATIVE
Creatinine,U: 74.62 mg/dL
Methadone: NEGATIVE
Opiates: NEGATIVE

## 2013-08-19 LAB — THYROID PANEL: Free T4: 0.86 ng/dL (ref 0.80–1.80)

## 2013-08-22 ENCOUNTER — Ambulatory Visit (INDEPENDENT_AMBULATORY_CARE_PROVIDER_SITE_OTHER): Payer: PRIVATE HEALTH INSURANCE | Admitting: Family Medicine

## 2013-08-22 ENCOUNTER — Encounter: Payer: Self-pay | Admitting: Family Medicine

## 2013-08-22 VITALS — BP 149/83 | HR 67 | Wt 320.0 lb

## 2013-08-22 DIAGNOSIS — R946 Abnormal results of thyroid function studies: Secondary | ICD-10-CM

## 2013-08-22 DIAGNOSIS — R7989 Other specified abnormal findings of blood chemistry: Secondary | ICD-10-CM

## 2013-08-22 DIAGNOSIS — I10 Essential (primary) hypertension: Secondary | ICD-10-CM

## 2013-08-22 DIAGNOSIS — E119 Type 2 diabetes mellitus without complications: Secondary | ICD-10-CM

## 2013-08-22 MED ORDER — AMBULATORY NON FORMULARY MEDICATION: Status: AC

## 2013-08-22 NOTE — Progress Notes (Signed)
  Subjective:    Patient ID: Cody Barnett, male    DOB: 1961/07/27, 52 y.o.   MRN: [id number]  HPI DM- Says won't be well controlled.  Says diet has been poor as has been working a lot of overtime. No wounds that aren't healing well. Giving 50 units of Lantus at night. Not checking sugars often. Has not been working out for 1.5 months.   HTN - Pt denies chest pain, SOB, dizziness, or heart palpitations.  Taking meds as directed w/o problems.  Denies medication side effects.    Also recently had elevated TSH. Thinks from his lithium. They are trying to wean him off.  He is supposed to recheck levels in 2 weeks.   Review of Systems     Objective:   Physical Exam  Constitutional: He is oriented to person, place, and time. He appears well-developed and well-nourished.  HENT:  Head: Normocephalic and atraumatic.  Cardiovascular: Normal rate, regular rhythm and normal heart sounds.   Pulmonary/Chest: Effort normal and breath sounds normal.  Neurological: He is alert and oriented to person, place, and time.  Skin: Skin is warm and dry.  Psychiatric: He has a normal mood and affect. His behavior is normal.          Assessment & Plan:  DM- Uncontrolled.  Will stop the Janumet and replace with Invomet. Samples given.  Already on ACE and pravastatin. Reminded him overdue for eye exam. Needs to start checking sugars again.  Lab Results  Component Value Date   HGBA1C 8.0 08/22/2013     HTN - Uncontrolled. Work on [employer] and gym and recheck in 3 mo. If not at coal will  Consider adding BB.

## 2013-08-22 NOTE — Patient Instructions (Signed)
Remember to get your eye exam.

## 2013-09-19 ENCOUNTER — Ambulatory Visit (INDEPENDENT_AMBULATORY_CARE_PROVIDER_SITE_OTHER): Payer: PRIVATE HEALTH INSURANCE | Admitting: Psychiatry

## 2013-09-19 ENCOUNTER — Encounter (HOSPITAL_COMMUNITY): Payer: Self-pay | Admitting: Psychiatry

## 2013-09-19 VITALS — BP 144/84 | HR 82 | Ht 71.0 in | Wt 318.0 lb

## 2013-09-19 DIAGNOSIS — F3189 Other bipolar disorder: Secondary | ICD-10-CM

## 2013-09-19 DIAGNOSIS — F909 Attention-deficit hyperactivity disorder, unspecified type: Secondary | ICD-10-CM

## 2013-09-19 DIAGNOSIS — F3181 Bipolar II disorder: Secondary | ICD-10-CM

## 2013-09-19 MED ORDER — METHYLPHENIDATE HCL ER (OSM) 36 MG PO TBCR: 36.0000 mg | EXTENDED_RELEASE_TABLET | ORAL | Status: DC

## 2013-09-19 MED ORDER — OXCARBAZEPINE 600 MG PO TABS: ORAL_TABLET | ORAL | Status: DC

## 2013-09-19 NOTE — Progress Notes (Signed)
[facility]al Health Follow-up Outpatient Visit  Cody Barnett [birth year]  Date: 08/18/2013  HPI Comments: SUBJECTIVE: Cody Barnett is a 52 year old male with a diagnosis of Attention Deficit Hyperactivity Disorder, Hyperactive type and Bipolar II Disorder, mixed.   The patient reports his anxiety has improved. He states his mood has remained stable.  He admits to stopping Lithium 30 days ago.  He continues to have irritability and frustration at work.  He states he has had outbursts at work over the past 30 day. He feels trileptal and concerta have provided the greatest benefit. He denies any other complaints.   In the area of affective symptoms, patient appears euthymic. Patient denies current suicidal ideation, intent, or plan. Patient denies current homicidal ideation, intent, or plan. Patient denies auditory hallucinations. Patient denies visual hallucinations. Patient denies symptoms of paranoia. Patient states sleep is fair, with approximately 7 hours of sleep per night.  Appetite is still increased. Energy level has been poor. Patient denies symptoms of anhedonia. Patient denies hopelessness, helplessness, but endorses guilt about how he raised his older.   He denies recent episodes consistent with mania, particularly decreased need for sleep with increased energy, grandiosity, impulsivity, hyper verbal and pressured speech, and increased productivity. Denies any recent symptoms consistent with psychosis, particularly auditory or visual hallucinations, thought broadcasting/insertion/withdrawal, or ideas of reference. Also denies excessive worry to the point of physical symptoms as well as any panic attacks. Denies any history of trauma or symptoms consistent with PTSD such as flashbacks, nightmares, hypervigilance, feelings of numbness or inability to connect with others.   The patient's wife is here for collateral information. She reports that the patient has underreported  his caffeine intake as well as not reporting several stressors, specifically related to his adult son who has substance abuse issues. She relates that the patient is particularly hard on their younger son (now a teenager) most likely for fear of him falling into drug use as well.    Review of Systems  Constitutional: Positive for weight loss. Negative for fever and chills.  Respiratory: Negative for cough, hemoptysis, sputum production and shortness of breath.   Cardiovascular: Negative for chest pain, palpitations and leg swelling.  Gastrointestinal: Negative for nausea, vomiting, diarrhea and constipation.  Neurological: Negative for weakness.   Filed Vitals:   09/19/13 1141  BP: 144/84  Pulse: 82  Height: 5\' 11"  (1.803 m)  Weight: 318 lb (144.244 kg)   Physical Exam  Constitutional: He appears well-developed and well-nourished. No distress.  Skin: He is not diaphoretic.  Neurological: No tremor Musculoskeletal: Strength & Muscle Tone: within normal limits Gait & Station: normal Patient leans: N/A   Past Psychiatric History:  Reviewed Diagnosis: Bipolar II disorder, most recent episode hypomanic, Attention Deficit Hyperactivity Disorder   Hospitalizations: None   Outpatient Care: [facility]   Substance Abuse Care: Remote   Self-Mutilation:Patient denies   Suicidal Attempts: Patient denies   Violent Behaviors: extreme anger    Past Medical History: Reviewed  Past Medical History  Diagnosis Date  . Bipolar disorder   . Anxiety   . ADD (attention deficit disorder with hyperactivity)   . Hypertension   . Diabetes mellitus, type II    History of Loss of Consciousness: No  Seizure History: No  Cardiac History: No   Allergies: No Known Allergies   Current Medications:  History of Loss of Consciousness: No  Seizure History: No  Cardiac History: Yes-Aortic Stenosis   Allergies: No Known Allergies   Current  Medications: Reviewed Current Outpatient Prescriptions on File  Prior to Visit  Medication Sig Dispense Refill  . AMBULATORY NON FORMULARY MEDICATION Medication Name:Test strip to test 3 x a day. Dx 250.02. 90 days supply  200 Units  11  . AMBULATORY NON FORMULARY MEDICATION Medication Name: invokamet 150/1000  - one po BID  60 tablet  5  . BYDUREON 2 MG SUSR INJECT INTO SKIN ONCE A WEEK AS DIRECTED  1 each  2  . furosemide (LASIX) 20 MG tablet TAKE 1 TABLET (20 MG TOTAL) BY MOUTH DAILY.  90 tablet  0  . glipiZIDE (GLUCOTROL XL) 5 MG 24 hr tablet TAKE 1 TABLET (5 MG TOTAL) BY MOUTH DAILY.  30 tablet  2  . lisinopril (PRINIVIL,ZESTRIL) 40 MG tablet TAKE 1 TABLET (40 MG TOTAL) BY MOUTH DAILY.  90 tablet  1  . LOVAZA 1 G capsule TAKE 1 CAPSULE FOUR TIMES A DAY  360 capsule  2  . methylphenidate (CONCERTA) 36 MG CR tablet Take 1 tablet (36 mg total) by mouth every morning.  30 tablet  0  . ONE TOUCH ULTRA TEST test strip       . oxcarbazepine (TRILEPTAL) 600 MG tablet Take 1.5 tablets (900 mg total) by mouth 2 (two) times daily.  90 tablet  1  . pravastatin (PRAVACHOL) 40 MG tablet Take 1 tablet (40 mg total) by mouth at bedtime.  30 tablet  2   No current facility-administered medications on file prior to visit.   Previous Psychotropic Medications: Reviewed  Medication  Dose   Desipramine  unknown   Nortriptyline  unknown   Wellbutrin  unknown    Substance Abuse History in the last 12 months: Reviewed  Caffeine: . Caffeine Beverages 108 ounces per day.  Nicotine: Patient denies.  Alcohol: Patient denies.  Illicit Drugs: Patient denies.   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  Hobbies/Interests: computers   Family History: Reviewed  Family History  Problem Relation Age of Onset  . ADD / ADHD Mother   . Hyperlipidemia Mother   . Hypertension Mother   . Dementia Mother   . ADD / ADHD Brother   . ADD / ADHD Son   . ADD / ADHD Son   . ADD / ADHD Daughter   . Prostate cancer Father 8  . Alzheimer's disease Father   . Colon cancer Father   . Stomach cancer Father   . Heart attack Maternal Grandmother    Psychiatric Specialty Exam: Objective: Appearance: Casual   Eye Contact:: Good   Speech: Clear and Coherent and Normal Rate   Volume: Normal   Mood: "good and generally up"   Depression-5-7/10  (0=Very depressed; 5=Neutral; 10=Very Happy) Anxiety-3/10(0=no anxiety; 5=moderate Anxious; 10+panic attacks)  Affect: Appropriate   Thought Process: Circumstantial   Orientation: Full   Thought Content: WDL   Suicidal Thoughts: No   Homicidal Thoughts: No   Judgement: Fair   Insight: Good   Psychomotor Activity: Normal   Akathisia:  No   Handed: Right   Memory: Immediate 3/3 recent-3/3  Memory: Intact  Assets: Communication Skills  Desire for Improvement  Financial Resources/Insurance  Housing  Social Support  Talents/Skills  Transportation  Vocational/Educational    Assessment:  AXIS I  Bipolar II disorder, most recent episode hypomanic, Attention Deficit Hyperactivity Disorder   AXIS II  No diagnosis   AXIS III  Aortic Stenosis, congenital   AXIS IV  economic problems, other psychosocial or environmental problems, problems related to social environment and problems with primary support group   AXIS V  51-60 moderate symptoms    Treatment Plan/Recommendations:  1. Affirm with the patient that the medications are taken as ordered.  Patient expressed understanding of how their medications were to be used.  2. Continue the following psychiatric medications as written prior to this appointment::  a) Discontinue- Lithium b) Concerta 36 mg-one tablet daily.  C) Increase Trileptal 1800 mg total daily d) Will consider addition of depakote, geodon, or risperidone if mood symptoms cannot be controlled with Lithium alone. Geodon being the best option given that it is more weight neutral. 3. Therapy: brief supportive therapy provided. Discussed psychosocial stressors. Discussed importance of control of diabetes for control of mood symptoms.  4. Risks and benefits, side effects and alternatives discussed with patient, he was given an opportunity to ask questions about his medication, illness, and treatment. All current psychiatric medications have been reviewed and discussed with the patient and adjusted as clinically appropriate. The patient has been provided an accurate and updated list of the medications being now prescribed.  5. Patient told to call clinic if any problems occur. Patient advised to go to ER if he should develop SI/HI, side effects, or if symptoms worsen. Has crisis numbers to call if needed.  6. Will order drug screen and continue random urine drug screens as long as patient is on concerta.  7. Reviewed labs. Will order repeat Thyroid panel. LAst TSH was 12. 8. The patient was encouraged to keep all PCP and specialty clinic appointments.  9. Patient was instructed to return to clinic in 1 month.  10. The patient expressed understanding of the plan outlined above and agrees with the plan.   Jacqulyn Cane, M.D.  09/19/2013 11:37 AM

## 2013-09-26 ENCOUNTER — Other Ambulatory Visit: Payer: Self-pay | Admitting: Family Medicine

## 2013-10-09 ENCOUNTER — Other Ambulatory Visit: Payer: Self-pay | Admitting: Family Medicine

## 2013-10-31 ENCOUNTER — Telehealth (HOSPITAL_COMMUNITY): Payer: Self-pay

## 2013-10-31 DIAGNOSIS — F909 Attention-deficit hyperactivity disorder, unspecified type: Secondary | ICD-10-CM

## 2013-10-31 DIAGNOSIS — F3181 Bipolar II disorder: Secondary | ICD-10-CM

## 2013-10-31 MED ORDER — METHYLPHENIDATE HCL ER (OSM) 36 MG PO TBCR: 36.0000 mg | EXTENDED_RELEASE_TABLET | ORAL | Status: DC

## 2013-10-31 NOTE — Telephone Encounter (Signed)
He reports that he has increase oxcarbazepine to 1200 BID.  Asked patient to call in 10 days to report results. Will fill concerta.

## 2013-11-16 ENCOUNTER — Other Ambulatory Visit: Payer: Self-pay | Admitting: Family Medicine

## 2013-11-23 ENCOUNTER — Ambulatory Visit (INDEPENDENT_AMBULATORY_CARE_PROVIDER_SITE_OTHER): Payer: PRIVATE HEALTH INSURANCE | Admitting: Family Medicine

## 2013-11-23 ENCOUNTER — Encounter: Payer: Self-pay | Admitting: Family Medicine

## 2013-11-23 VITALS — BP 124/70 | HR 80 | Temp 97.5°F | Ht 71.0 in | Wt 313.0 lb

## 2013-11-23 DIAGNOSIS — F3189 Other bipolar disorder: Secondary | ICD-10-CM

## 2013-11-23 DIAGNOSIS — E119 Type 2 diabetes mellitus without complications: Secondary | ICD-10-CM

## 2013-11-23 DIAGNOSIS — F3181 Bipolar II disorder: Secondary | ICD-10-CM

## 2013-11-23 DIAGNOSIS — Z6841 Body Mass Index (BMI) 40.0 and over, adult: Secondary | ICD-10-CM

## 2013-11-23 DIAGNOSIS — Z1211 Encounter for screening for malignant neoplasm of colon: Secondary | ICD-10-CM

## 2013-11-23 DIAGNOSIS — I10 Essential (primary) hypertension: Secondary | ICD-10-CM

## 2013-11-23 LAB — POCT GLYCOSYLATED HEMOGLOBIN (HGB A1C): Hemoglobin A1C: 7.2

## 2013-11-23 MED ORDER — INSULIN GLARGINE 100 UNIT/ML SOLOSTAR PEN: PEN_INJECTOR | SUBCUTANEOUS | Status: DC

## 2013-11-23 NOTE — Progress Notes (Signed)
   Subjective:    Patient ID: Cody Barnett, male    DOB: 1961/11/30, 52 y.o.   MRN: [id number]  HPI DM- Lantus 50 units at bedtime.  AM sugars running around 110 last few weeks.  .  Ran out of [place] for about a month. Admits has been working out and hasn't been eating well but jas just stared back to the gym.  Due for eye exam.    Bipolar - recently went up on the Trileptal.  Has had some diarrhea for the last week or so.  Several family members have been sick as well.   HTN-  Pt denies chest pain, SOB, dizziness, or heart palpitations.  Taking meds as directed w/o problems.  Denies medication side effects.     Review of Systems     Objective:   Physical Exam  Constitutional: He is oriented to person, place, and time. He appears well-developed and well-nourished.  HENT:  Head: Normocephalic and atraumatic.  Cardiovascular: Normal rate and regular rhythm.   Murmur heard. 2/6 SEM, harsh  Pulmonary/Chest: Effort normal and breath sounds normal.  Neurological: He is alert and oriented to person, place, and time.  Skin: Skin is warm and dry.  Psychiatric: He has a normal mood and affect. His behavior is normal.          Assessment & Plan:  DM- Plan on making appt for eye exam. On ACEi, statin.  Due for urine microalbumin.  F/u in 3-4 months.   Diarrhea - call if not resolving next week.  Likley vira. Stay hydrated.   Bipolar - meds recently adjusted but doing well.   HTN - well controlled.  Continue current regimen.F/U in 3-4 months.   BMI 40-encouraged him to get back into the gym now that his heel is feeling much better and continue work on diet. Also taking the bydureon consistently will help as well.  Refer for screening colonoscopy.

## 2013-11-24 ENCOUNTER — Telehealth (HOSPITAL_COMMUNITY): Payer: Self-pay

## 2013-11-24 DIAGNOSIS — F909 Attention-deficit hyperactivity disorder, unspecified type: Secondary | ICD-10-CM

## 2013-11-24 DIAGNOSIS — F3181 Bipolar II disorder: Secondary | ICD-10-CM

## 2013-11-24 MED ORDER — METHYLPHENIDATE HCL ER (OSM) 36 MG PO TBCR: 36.0000 mg | EXTENDED_RELEASE_TABLET | ORAL | Status: DC

## 2013-11-24 MED ORDER — OXCARBAZEPINE 600 MG PO TABS: 1200.0000 mg | ORAL_TABLET | Freq: Two times a day (BID) | ORAL | Status: DC

## 2013-11-24 NOTE — Telephone Encounter (Signed)
The patient is doing better with his moods but has also been doing better with controlling his blood sugars.

## 2013-11-29 ENCOUNTER — Other Ambulatory Visit: Payer: Self-pay | Admitting: Family Medicine

## 2014-01-23 ENCOUNTER — Telehealth (HOSPITAL_COMMUNITY): Payer: Self-pay

## 2014-01-23 ENCOUNTER — Ambulatory Visit (INDEPENDENT_AMBULATORY_CARE_PROVIDER_SITE_OTHER): Payer: PRIVATE HEALTH INSURANCE | Admitting: Psychiatry

## 2014-01-23 ENCOUNTER — Encounter (HOSPITAL_COMMUNITY): Payer: Self-pay | Admitting: Psychiatry

## 2014-01-23 ENCOUNTER — Other Ambulatory Visit: Payer: Self-pay | Admitting: Family Medicine

## 2014-01-23 ENCOUNTER — Encounter (INDEPENDENT_AMBULATORY_CARE_PROVIDER_SITE_OTHER): Payer: Self-pay

## 2014-01-23 VITALS — BP 135/92 | HR 77 | Wt 326.0 lb

## 2014-01-23 DIAGNOSIS — F3181 Bipolar II disorder: Secondary | ICD-10-CM

## 2014-01-23 DIAGNOSIS — F909 Attention-deficit hyperactivity disorder, unspecified type: Secondary | ICD-10-CM

## 2014-01-23 DIAGNOSIS — F3189 Other bipolar disorder: Secondary | ICD-10-CM

## 2014-01-23 MED ORDER — METHYLPHENIDATE HCL ER (OSM) 36 MG PO TBCR: 36.0000 mg | EXTENDED_RELEASE_TABLET | ORAL | Status: DC

## 2014-01-23 MED ORDER — OXCARBAZEPINE 600 MG PO TABS: 1200.0000 mg | ORAL_TABLET | Freq: Two times a day (BID) | ORAL | Status: DC

## 2014-01-23 NOTE — Telephone Encounter (Signed)
Refilled concerta

## 2014-01-23 NOTE — Progress Notes (Signed)
[facility] Follow-up Outpatient Visit  Cheree DittoGregory W Whipple 1961/09/30  Date: 01/23/2014  HPI Comments: Mr. Cody MelterGregory Barnett is a 53 year old male with a diagnosis of Attention Deficit Hyperactivity Disorder, Hyperactive type and Bipolar II Disorder, mixed.    . Location: The patient reports his anxiety has improved. The patient reports he has been doing relatively well. He reports his only stress relates to his son's behavior.  . Quality: In the area of affective symptoms, patient appears euthymic. Patient denies current suicidal ideation, intent, or plan. Patient denies current homicidal ideation, intent, or plan. Patient denies auditory hallucinations. Patient denies visual hallucinations. Patient denies symptoms of paranoia. Patient states sleep is fair, with approximately 7 hours of sleep per night.  Appetite is still increased. Energy level has been poor. Patient denies symptoms of anhedonia. Patient denies hopelessness, helplessness, but endorses guilt about how he raised his older.   . Severity:  Depression: 7/10 (0=Very depressed; 5=Neutral; 10=Very Happy)  Anxiety- 3-4/10 (0=no anxiety; 5= moderate/tolerable anxiety; 10= panic attacks)  . Duration: Mood disorder- Since Mid-1990s  . Timing: No specific timings  . Context: Difficulty with son's behavior.  . Modifying factors:  . Associated signs and symptoms: He denies recent episodes consistent with mania, particularly decreased need for sleep with increased energy, grandiosity, impulsivity, hyper verbal and pressured speech, and increased productivity.  Denies any recent symptoms consistent with psychosis, particularly auditory or visual hallucinations, thought broadcasting/insertion/withdrawal, or ideas of reference. Also denies excessive worry to the point of physical symptoms as well as any panic attacks. Denies any history of trauma or symptoms consistent with PTSD such as flashbacks, nightmares,  hypervigilance, feelings of numbness or inability to connect with others.    Review of Systems  Constitutional: Negative for fever, chills and weight loss.  Respiratory: Negative for cough, hemoptysis, sputum production and shortness of breath.   Cardiovascular: Negative for chest pain, palpitations and leg swelling.  Gastrointestinal: Negative for nausea, vomiting, diarrhea and constipation.  Genitourinary: Negative for dysuria, urgency and frequency.  Neurological: Negative for dizziness, tingling, tremors, seizures, loss of consciousness and weakness.   Filed Vitals:   01/23/14 1329  BP: 135/92  Pulse: 77  Weight: 326 lb (147.873 kg)   Physical Exam  Constitutional: He appears well-developed and well-nourished. No distress.  Skin: He is not diaphoretic.  Neurological: No tremor Musculoskeletal: Gait & Station: normal Patient leans: N/A   Past Psychiatric History:  Reviewed Diagnosis: Bipolar II disorder, most recent episode hypomanic, Attention Deficit Hyperactivity Disorder   Hospitalizations: None   [facility]   Substance Abuse Care: Remote   Self-Mutilation:Patient denies   Suicidal Attempts: Patient denies   Violent Behaviors: extreme anger    Past Medical History: Reviewed  Past Medical History  Diagnosis Date  . Bipolar disorder   . Anxiety   . ADD (attention deficit disorder with hyperactivity)   . Hypertension   . Diabetes mellitus, type II    History of Loss of Consciousness: No  Seizure History: No  Cardiac History: No   Allergies: No Known Allergies   Current Medications:  History of Loss of Consciousness: No  Seizure History: No  Cardiac History: Yes-Aortic Stenosis   Allergies: No Known Allergies   Current Medications: Reviewed Current Outpatient Prescriptions on File Prior to Visit  Medication Sig Dispense Refill  . AMBULATORY NON FORMULARY MEDICATION Medication Name:Test strip to test 3 x a day. Dx 250.02. 90 days supply  200  Units  11  . AMBULATORY  NON FORMULARY MEDICATION Medication Name: invokamet 150/1000  - one po BID  60 tablet  5  . BYDUREON 2 MG SUSR INJECT INTO SKIN ONCE A WEEK AS DIRECTED  1 each  2  . Canagliflozin-Metformin HCl [id number] MG TABS Take 1 tablet by mouth 2 (two) times daily.      . furosemide (LASIX) 20 MG tablet TAKE 1 TABLET (20 MG TOTAL) BY MOUTH DAILY.  90 tablet  0  . glipiZIDE (GLUCOTROL XL) 5 MG 24 hr tablet TAKE 1 TABLET (5 MG TOTAL) BY MOUTH DAILY.  30 tablet  2  . Insulin Glargine (LANTUS SOLOSTAR) 100 UNIT/ML SOPN INJECT 0.5  MLS (50 UNITS TOTAL) INTO THE SKIN AT BEDTIME.  15 pen  1  . lisinopril (PRINIVIL,ZESTRIL) 40 MG tablet TAKE 1 TABLET (40 MG TOTAL) BY MOUTH DAILY.  90 tablet  1  . methylphenidate (CONCERTA) 36 MG CR tablet Take 1 tablet (36 mg total) by mouth every morning.  30 tablet  0  . omega-3 acid ethyl esters (LOVAZA) 1 G capsule TAKE 1 CAPSULE FOUR TIMES A DAY  360 capsule  2  . ONE TOUCH ULTRA TEST test strip       . oxcarbazepine (TRILEPTAL) 600 MG tablet Take 2 tablets (1,200 mg total) by mouth 2 (two) times daily.  120 tablet  1  . pravastatin (PRAVACHOL) 40 MG tablet TAKE 1 TABLET (40 MG TOTAL) BY MOUTH AT BEDTIME.  30 tablet  2   No current facility-administered medications on file prior to visit.   Previous Psychotropic Medications: Reviewed  Medication  Dose   Desipramine  unknown   Nortriptyline  unknown   Wellbutrin  unknown    Substance Abuse History in the last 12 months: Reviewed  Caffeine: . Caffeine Beverages 108 ounces per day.  Nicotine: Patient denies.  Alcohol: Patient denies.  Illicit Drugs: Patient denies.   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  Hobbies/Interests: computers   Family History: Reviewed  Family History  Problem Relation Age of Onset  . ADD / ADHD Mother   . Hyperlipidemia Mother   . Hypertension Mother   . Dementia Mother   . ADD / ADHD Brother   . ADD / ADHD Son   . ADD / ADHD Son   . ADD / ADHD Daughter   . Prostate cancer Father 22  . Alzheimer's disease Father   . Colon cancer Father   . Stomach cancer Father   . Heart attack Maternal Grandmother    Psychiatric Specialty Exam: Objective: Appearance: Casual   Eye Contact:: Good   Speech: Clear and Coherent and Normal Rate   Volume: Normal   Mood: "good"     Affect: Appropriate   Thought Process: Circumstantial   Orientation: Full   Thought Content: WDL   Suicidal Thoughts: No   Homicidal Thoughts: No   Judgement: Fair   Insight: Good   Psychomotor Activity: Normal   Akathisia: No   Handed: Right   Memory: Immediate  3/3 recent-3/3  Memory: Intact  Assets: Communication Skills  Desire for Improvement  Financial Resources/Insurance  Housing  Social Support  Talents/Skills  Transportation  Vocational/Educational    Assessment:  Bipolar II disorder, most recent episode hypomanic-stable Attention Deficit Hyperactivity Disorder-Stable AXIS I  Bipolar II disorder, most recent episode hypomanic, Attention Deficit Hyperactivity Disorder  AXIS II  No diagnosis   AXIS III  Aortic Stenosis, congenital   AXIS IV  economic problems, other psychosocial or environmental problems, problems related to social environment and problems with primary support group   AXIS V  51-60 moderate symptoms    Treatment Plan/Recommendations:  1. Affirm with the patient  that the medications are taken as ordered. Patient expressed understanding of how their medications were to be used.  2. Continue the following psychiatric medications as written prior to this appointment::  a)Continue Trileptal 2400 mg total daily b) Concerta 36 mg-one tablet daily.  C)Will consider addition of depakote, geodon, or risperidone. Geodon being the best option given that it is more weight neutral. 3. Therapy: brief supportive therapy provided. Discussed psychosocial stressors. Discussed importance of control of diabetes for control of mood symptoms. More than 50% of the visit was spent on individual therapy/counseling. 4. Risks and benefits, side effects and alternatives discussed with patient, he was given an opportunity to ask questions about his medication, illness, and treatment. All current psychiatric medications have been reviewed and discussed with the patient and adjusted as clinically appropriate. The patient has been provided an accurate and updated list of the medications being now prescribed.  5. Patient told to call clinic if any problems occur. Patient advised to go to ER if he should develop SI/HI, side effects, or if symptoms worsen. Has crisis numbers to call if needed.  6. Will order drug screen and continue random urine drug screens as long as patient is on concerta.  7. No labs warranted at this time. 8. The patient was encouraged to keep all PCP and specialty clinic appointments.  9. Patient was instructed to return to clinic in 1 month.  10. The patient expressed understanding of the plan outlined above and agrees with the plan.  Time Spent: 30 minutes Jacqulyn Cane, M.D.  01/23/2014 1:27 PM

## 2014-02-17 ENCOUNTER — Other Ambulatory Visit: Payer: Self-pay | Admitting: Family Medicine

## 2014-02-22 ENCOUNTER — Ambulatory Visit: Payer: Self-pay | Admitting: Family Medicine

## 2014-02-27 ENCOUNTER — Telehealth (HOSPITAL_COMMUNITY): Payer: Self-pay | Admitting: *Deleted

## 2014-02-27 DIAGNOSIS — F3181 Bipolar II disorder: Secondary | ICD-10-CM

## 2014-02-27 DIAGNOSIS — F909 Attention-deficit hyperactivity disorder, unspecified type: Secondary | ICD-10-CM

## 2014-02-27 MED ORDER — METHYLPHENIDATE HCL ER (OSM) 36 MG PO TBCR: 36.0000 mg | EXTENDED_RELEASE_TABLET | ORAL | Status: DC

## 2014-02-27 NOTE — Telephone Encounter (Signed)
[facility] [place] [facility] called.States patient requests refill of Concerta.[place] providing coverage in his absence

## 2014-02-28 ENCOUNTER — Other Ambulatory Visit: Payer: Self-pay | Admitting: Family Medicine

## 2014-03-01 ENCOUNTER — Ambulatory Visit (INDEPENDENT_AMBULATORY_CARE_PROVIDER_SITE_OTHER): Payer: PRIVATE HEALTH INSURANCE | Admitting: Family Medicine

## 2014-03-01 ENCOUNTER — Encounter: Payer: Self-pay | Admitting: Family Medicine

## 2014-03-01 VITALS — BP 138/76 | HR 86 | Temp 99.6°F | Wt 318.0 lb

## 2014-03-01 DIAGNOSIS — K5909 Other constipation: Secondary | ICD-10-CM

## 2014-03-01 DIAGNOSIS — E119 Type 2 diabetes mellitus without complications: Secondary | ICD-10-CM

## 2014-03-01 DIAGNOSIS — I10 Essential (primary) hypertension: Secondary | ICD-10-CM

## 2014-03-01 DIAGNOSIS — K5900 Constipation, unspecified: Secondary | ICD-10-CM

## 2014-03-01 DIAGNOSIS — E785 Hyperlipidemia, unspecified: Secondary | ICD-10-CM

## 2014-03-01 DIAGNOSIS — Z6841 Body Mass Index (BMI) 40.0 and over, adult: Secondary | ICD-10-CM

## 2014-03-01 LAB — POCT GLYCOSYLATED HEMOGLOBIN (HGB A1C): HEMOGLOBIN A1C: 7.2

## 2014-03-01 MED ORDER — LIRAGLUTIDE 18 MG/3ML ~~LOC~~ SOPN: 1.8000 mg | PEN_INJECTOR | Freq: Every day | SUBCUTANEOUS | Status: DC

## 2014-03-01 NOTE — Progress Notes (Signed)
   Subjective:    Patient ID: Cody Barnett, male    DOB: 21-Nov-1961, 53 y.o.   MRN: [id number]  HPI Diabetes - no hypoglycemic events. No wounds or sores that are not healing well. No increased thirst or urination. Not checking glucose at home. Taking medications as prescribed without any side effects. Just got back form a cruise. Using 50 units of Lantus.  Hasn't been watching diet well.  Has had a lot of stressors at home with his son. fogetting to take his bydureon weekly.   Hypertension- Pt denies chest pain, SOB, dizziness, or heart palpitations.  Taking meds as directed w/o problems.  Denies medication side effects.    Chronic constipation- comes and goes.  HAs been trying to increase fiber recently to see if this helps with consistency. He did see neurology at one point but says that he did not really give him any specific recommendations except for increased fiber..    Hyperlipidemia-tolerating statin well without any side effects or problems. Review of Systems     Objective:   Physical Exam  Constitutional: He is oriented to person, place, and time. He appears well-developed and well-nourished.  HENT:  Head: Normocephalic and atraumatic.  Cardiovascular: Normal rate, regular rhythm and normal heart sounds.   Pulmonary/Chest: Effort normal and breath sounds normal.  Neurological: He is alert and oriented to person, place, and time.  Skin: Skin is warm and dry.  Psychiatric: He has a normal mood and affect. His behavior is normal.      Assessment & Plan:  Diabetes-plans to call [facility] to schedule an eye exam. Not maximally controlled.   A1C is 7.2 today.  Will switch to Victoza for better control since forgetting the bydureon. Discussed exercise and diet and continued weight loss to really reach his goal. I would hold off on adjusting his medication and try to work on lifestyle changes in the next 3 months. He admits he could definitely do a lot  better.  Hypertension-well controlled.  Continue current regimen.   chronic constipation - work on increasing diet.  Consider adding miralax daily for better control. Consider Rx options as well if not helping.    Hyperlipidemia- due ot recheck in April. Lab slip given today.

## 2014-03-26 ENCOUNTER — Ambulatory Visit (INDEPENDENT_AMBULATORY_CARE_PROVIDER_SITE_OTHER): Payer: PRIVATE HEALTH INSURANCE | Admitting: Psychiatry

## 2014-03-26 ENCOUNTER — Encounter (INDEPENDENT_AMBULATORY_CARE_PROVIDER_SITE_OTHER): Payer: Self-pay

## 2014-03-26 ENCOUNTER — Encounter (HOSPITAL_COMMUNITY): Payer: Self-pay | Admitting: Psychiatry

## 2014-03-26 VITALS — BP 148/76 | HR 70 | Wt 321.0 lb

## 2014-03-26 DIAGNOSIS — F909 Attention-deficit hyperactivity disorder, unspecified type: Secondary | ICD-10-CM

## 2014-03-26 DIAGNOSIS — F3181 Bipolar II disorder: Secondary | ICD-10-CM

## 2014-03-26 DIAGNOSIS — F3189 Other bipolar disorder: Secondary | ICD-10-CM

## 2014-03-26 MED ORDER — OXCARBAZEPINE 600 MG PO TABS: 1200.0000 mg | ORAL_TABLET | Freq: Two times a day (BID) | ORAL | Status: DC

## 2014-03-26 MED ORDER — METHYLPHENIDATE HCL ER (OSM) 36 MG PO TBCR
36.0000 mg | EXTENDED_RELEASE_TABLET | ORAL | Status: DC
Start: 2014-03-26 — End: 2014-05-24

## 2014-03-26 NOTE — Progress Notes (Signed)
[facility] Follow-up Outpatient Visit  Cody Barnett December 05, 1961  Date: 03/26/2014  HPI Comments: [name] is a 53 year old male with a diagnosis of Attention Deficit Hyperactivity Disorder, Hyperactive type and Bipolar II Disorder, mixed.    . Location: The patient reports his mood fluctuates secondary to stress related to finances and stress from his son.   . Quality: In the area of affective symptoms, patient appears euthymic. Patient denies current suicidal ideation, intent, or plan. Patient denies current homicidal ideation, intent, or plan. Patient denies auditory hallucinations. Patient denies visual hallucinations. Patient denies symptoms of paranoia. Patient states sleep is fair, with approximately 7 hours of sleep per night.  Appetite is still down with some binging. Energy level has been poor. Patient denies symptoms of anhedonia. Patient denies hopelessness, helplessness, or guilt.  . Severity:  Depression: 7/10 (0=Very depressed; 5=Neutral; 10=Very Happy)  Anxiety- 3-4/10 (0=no anxiety; 5= moderate/tolerable anxiety; 10= panic attacks)  . Duration: Mood disorder- Since Mid-1990s  . Timing: No specific timings  . Context: Difficulty with son's behavior.  . Modifying factors:  . Associated signs and symptoms: He denies recent episodes consistent with mania, particularly decreased need for sleep with increased energy, grandiosity, impulsivity, hyper verbal and pressured speech, and increased productivity.  Denies any recent symptoms consistent with psychosis, particularly auditory or visual hallucinations, thought broadcasting/insertion/withdrawal, or ideas of reference. Also denies excessive worry to the point of physical symptoms as well as any panic attacks. Denies any history of trauma or symptoms consistent with PTSD such as flashbacks, nightmares, hypervigilance, feelings of numbness or inability to connect with others.    Review of  Systems  Constitutional: Negative for fever, chills and weight loss.  Respiratory: Negative for cough, hemoptysis, sputum production and shortness of breath.   Cardiovascular: Negative for chest pain, palpitations and leg swelling.  Gastrointestinal: Negative for nausea, vomiting, diarrhea and constipation.  Genitourinary: Negative for dysuria, urgency and frequency.  Neurological: Negative for dizziness, tingling, tremors, seizures, loss of consciousness and weakness.   Filed Vitals:   03/26/14 0942  BP: 148/76  Pulse: 70  Weight: 321 lb (145.605 kg)   Physical Exam  Constitutional: He appears well-developed and well-nourished. No distress.  Skin: He is not diaphoretic.  Neurological: No tremor Musculoskeletal: Gait & Station: normal Patient leans: N/A   Past Psychiatric History:  Reviewed Diagnosis: Bipolar II disorder, most recent episode hypomanic, Attention Deficit Hyperactivity Disorder   Hospitalizations: None   Outpatient Care: [facility]   Substance Abuse Care: Remote   Self-Mutilation:Patient denies   Suicidal Attempts: Patient denies   Violent Behaviors: extreme anger    Past Medical History: Reviewed  Past Medical History  Diagnosis Date  . Bipolar disorder   . Anxiety   . ADD (attention deficit disorder with hyperactivity)   . Hypertension   . Diabetes mellitus, type II    History of Loss of Consciousness: No  Seizure History: No  Cardiac History: No   Allergies: No Known Allergies   Current Medications:  History of Loss of Consciousness: No  Seizure History: No  Cardiac History: Yes-Aortic Stenosis   Allergies: No Known Allergies   Current Medications: Reviewed Current Outpatient Prescriptions on File Prior to Visit  Medication Sig Dispense Refill  . AMBULATORY NON FORMULARY MEDICATION Medication Name:Test strip to test 3 x a day. Dx 250.02. 90 days supply  200 Units  11  . AMBULATORY NON FORMULARY MEDICATION Medication Name: invokamet 150/1000  -  one po BID  60 tablet  5  . Canagliflozin-Metformin HCl [id number] MG TABS Take 1 tablet by mouth 2 (two) times daily.      . furosemide (LASIX) 20 MG tablet TAKE 1 TABLET (20 MG TOTAL) BY MOUTH DAILY.  90 tablet  0  . glipiZIDE (GLUCOTROL XL) 5 MG 24 hr tablet TAKE 1 TABLET (5 MG TOTAL) BY MOUTH DAILY.  30 tablet  2  . LANTUS SOLOSTAR 100 UNIT/ML Solostar Pen INJECT 0.5 MLS (50 UNITS TOTAL) INTO THE SKIN AT BEDTIME.  15 pen  1  . Liraglutide (VICTOZA) 18 MG/3ML SOPN Inject 1.8 mg into the skin daily.  15 mL  6  . lisinopril (PRINIVIL,ZESTRIL) 40 MG tablet TAKE 1 TABLET (40 MG TOTAL) BY MOUTH DAILY.  90 tablet  1  . methylphenidate (CONCERTA) 36 MG CR tablet Take 1 tablet (36 mg total) by mouth every morning.  30 tablet  0  . omega-3 acid ethyl esters (LOVAZA) 1 G capsule TAKE 1 CAPSULE FOUR TIMES A DAY  360 capsule  2  . ONE TOUCH ULTRA TEST test strip       . oxcarbazepine (TRILEPTAL) 600 MG tablet Take 2 tablets (1,200 mg total) by mouth 2 (two) times daily.  120 tablet  1  . pravastatin (PRAVACHOL) 40 MG tablet TAKE 1 TABLET (40 MG TOTAL) BY MOUTH AT BEDTIME.  30 tablet  2   No current facility-administered medications on file prior to visit.   Previous Psychotropic Medications: Reviewed  Medication  Dose   Desipramine  unknown   Nortriptyline  unknown   Wellbutrin  unknown    Substance Abuse History in the last 12 months: Reviewed  Caffeine: . Caffeine Beverages 108 ounces per day.  Nicotine: Patient denies.  Alcohol: Patient denies.  Illicit Drugs: Patient denies.   Blackouts: No  DT's: No  Withdrawal Symptoms: No   Social History: Reviewed  Current Place of Residence:Place of Birth: [name]  Family Members: 4  Marital Status: Married  Children: 2  Sons: 2  Daughters: 1  Relationships: ex-wife and older son, never treated for ADHD  Education: unknown  Educational Problems/Performance: affected by ADHD, hyperactive, treated at [facility]  Religious Beliefs/Practices: Former  Banker; [employer] manager saint  History of Abuse: unknown  Occupational Experiences: Clinical biochemist. Military History: 6 month-medical discharge  Legal History: Patient denies.  Hobbies/Interests: computers   Family History: Reviewed  Family History  Problem Relation Age of Onset  . ADD / ADHD Mother   . Hyperlipidemia Mother   . Hypertension Mother   . Dementia Mother   . ADD / ADHD Brother   . ADD / ADHD Son   . ADD / ADHD Son   . ADD / ADHD Daughter   . Prostate cancer Father 3  . Alzheimer's disease Father   . Colon cancer Father   . Stomach cancer Father   . Heart attack Maternal Grandmother    Psychiatric Specialty Exam: Objective: Appearance: Casual   Eye Contact:: Good   Speech: Clear and Coherent and Normal Rate   Volume: Normal   Mood: "good"    Affect: Appropriate   Thought Process: Circumstantial   Orientation: Full   Thought Content: WDL   Suicidal Thoughts: No   Homicidal Thoughts: No   Judgement: Fair   Insight: Good   Psychomotor Activity: Normal   Akathisia: No   Handed: Right   Memory: Immediate 3/3 recent-3/3  Language-Intact  Knowledge-Above Average  Memory: Intact  Assets: Communication Skills  Desire for  Improvement  Lawyerinancial Resources/Insurance  Housing  Social Support  Talents/Skills  Transportation  Vocational/Educational    Assessment:  Bipolar II disorder, most recent episode hypomanic-stable Attention Deficit Hyperactivity Disorder-Stable AXIS I  Bipolar II disorder, most recent episode, hypomanic-improving, Attention Deficit Hyperactivity Disorder-stable  AXIS II  No diagnosis   AXIS III  Aortic Stenosis, congenital   AXIS IV  economic problems, other psychosocial or environmental problems, problems related to social environment and problems with primary support group   AXIS V  51-60 moderate symptoms    Treatment Plan/Recommendations:  1. Affirm with the patient that the medications are taken as ordered.  Patient expressed understanding of how their medications were to be used.  2. Continue the following psychiatric medications as written prior to this appointment::  a)Continue Trileptal 2400 mg total daily b) Concerta 36 mg-one tablet daily.  C Counseled patient about importance of maintaining blood sugars to help stabilize moods. 3. Therapy: brief supportive therapy provided. Discussed psychosocial stressors. Discussed importance of control of diabetes for control of mood symptoms. More than 50% of the visit was spent on individual therapy/counseling. 4. Risks and benefits, side effects and alternatives discussed with patient, he was given an opportunity to ask questions about his medication, illness, and treatment. All current psychiatric medications have been reviewed and discussed with the patient and adjusted as clinically appropriate. The patient has been provided an accurate and updated list of the medications being now prescribed.  5. Patient told to call clinic if any problems occur. Patient advised to go to ER if he should develop SI/HI, side effects, or if symptoms worsen. Has crisis numbers to call if needed.  6. Will order drug screen and continue random urine drug screens as long as patient is on concerta.  7. No labs warranted at this time. 8. The patient was encouraged to keep all PCP and specialty clinic appointments.  9. Patient was instructed to return to clinic in 3 month.  10. The patient expressed understanding of the plan outlined above and agrees with the plan.  11. Patient informed that April 15th, 2015 would be my last day at this clinic.  Time Spent: 30 minutes  Jacqulyn CaneSHAJI Kaylin Schellenberg, M.D.  03/26/2014 9:40 AM

## 2014-04-06 ENCOUNTER — Other Ambulatory Visit: Payer: Self-pay | Admitting: Family Medicine

## 2014-05-12 ENCOUNTER — Other Ambulatory Visit: Payer: Self-pay | Admitting: Family Medicine

## 2014-05-22 ENCOUNTER — Other Ambulatory Visit: Payer: Self-pay | Admitting: Family Medicine

## 2014-05-23 ENCOUNTER — Telehealth: Payer: Self-pay

## 2014-05-23 DIAGNOSIS — F909 Attention-deficit hyperactivity disorder, unspecified type: Secondary | ICD-10-CM

## 2014-05-23 DIAGNOSIS — F3181 Bipolar II disorder: Secondary | ICD-10-CM

## 2014-05-23 NOTE — Telephone Encounter (Signed)
Cody Barnett needs a refill on Trileptal 600 mg 2 bid for bipolar and Concerta 36 mg once daily. He was a patient of Dr Laury Deep and he will not start with [facility] until August. Is a refill ok?

## 2014-05-24 MED ORDER — METHYLPHENIDATE HCL ER (OSM) 36 MG PO TBCR: 36.0000 mg | EXTENDED_RELEASE_TABLET | ORAL | Status: DC

## 2014-05-24 MED ORDER — OXCARBAZEPINE 600 MG PO TABS: 1200.0000 mg | ORAL_TABLET | Freq: Two times a day (BID) | ORAL | Status: DC

## 2014-05-24 NOTE — Telephone Encounter (Signed)
RX sent. please notify pt. Will need to pick up concerta.

## 2014-05-24 NOTE — Telephone Encounter (Signed)
Dad informed of rx pickup and that we will only fill until August but then they need to have a new therapist per Dr. Linford Arnold.  Meyer Cory, LPN

## 2014-06-01 ENCOUNTER — Ambulatory Visit: Payer: Self-pay | Admitting: Sports Medicine

## 2014-06-05 ENCOUNTER — Ambulatory Visit (INDEPENDENT_AMBULATORY_CARE_PROVIDER_SITE_OTHER): Payer: PRIVATE HEALTH INSURANCE | Admitting: Family Medicine

## 2014-06-05 ENCOUNTER — Encounter: Payer: Self-pay | Admitting: Family Medicine

## 2014-06-05 VITALS — BP 135/68 | HR 95 | Wt 316.0 lb

## 2014-06-05 DIAGNOSIS — E119 Type 2 diabetes mellitus without complications: Secondary | ICD-10-CM

## 2014-06-05 DIAGNOSIS — I10 Essential (primary) hypertension: Secondary | ICD-10-CM

## 2014-06-05 LAB — POCT GLYCOSYLATED HEMOGLOBIN (HGB A1C): Hemoglobin A1C: 7

## 2014-06-05 NOTE — Patient Instructions (Signed)
Check your sugar 3 days per week and bring in glucose log with you.

## 2014-06-05 NOTE — Progress Notes (Signed)
   Subjective:    Patient ID: Cody Barnett, male    DOB: [birth year], 53 y.o.   MRN: [id number]  HPI Diabetes - no hypoglycemic events. No wounds or sores that are not healing well. No increased thirst or urination. Not really checking glucose at home. Taking medications as prescribed without any side effects.  Still not had eye exam done.  Using 30-50 units.  Hypertension- Pt denies chest pain, SOB, dizziness, or heart palpitations.  Taking meds as directed w/o problems.  Denies medication side effects.    Hx of recurrent plantar fascitis. Says it has kept him from exercising.  He is interested in custom orthotics.    Review of Systems     Objective:   Physical Exam  Constitutional: He is oriented to person, place, and time. He appears well-developed and well-nourished.  HENT:  Head: Normocephalic and atraumatic.  Cardiovascular: Normal rate, regular rhythm and normal heart sounds.   Pulmonary/Chest: Effort normal and breath sounds normal.  Neurological: He is alert and oriented to person, place, and time.  Skin: Skin is warm and dry.  Psychiatric: He has a normal mood and affect. His behavior is normal.          Assessment & Plan:  DM- ON statin, ACE.  Recommend starting a baby ASA.  Still hasn't made appt for his eye exam. Foot exam performed today  Due for lipids. Strongly encouraged him to start checking his blood sugars and bring in a log with him. He did not go for his lab work in March. I reprinted the PIP dorsally today and encouraged him to go.  HTN- well controlled. Continue current regimen.  Recommend refer to Dr. Karie Schwalbe for evaluation for custom orthotics.

## 2014-06-07 ENCOUNTER — Encounter (HOSPITAL_COMMUNITY): Payer: Self-pay | Admitting: Physician Assistant

## 2014-06-07 ENCOUNTER — Ambulatory Visit (INDEPENDENT_AMBULATORY_CARE_PROVIDER_SITE_OTHER): Payer: PRIVATE HEALTH INSURANCE | Admitting: Physician Assistant

## 2014-06-07 VITALS — BP 148/78 | HR 93 | Ht 71.0 in | Wt 311.0 lb

## 2014-06-07 DIAGNOSIS — F909 Attention-deficit hyperactivity disorder, unspecified type: Secondary | ICD-10-CM

## 2014-06-07 DIAGNOSIS — F3181 Bipolar II disorder: Secondary | ICD-10-CM

## 2014-06-07 DIAGNOSIS — F988 Other specified behavioral and emotional disorders with onset usually occurring in childhood and adolescence: Secondary | ICD-10-CM

## 2014-06-07 DIAGNOSIS — F319 Bipolar disorder, unspecified: Secondary | ICD-10-CM

## 2014-06-07 MED ORDER — METHYLPHENIDATE HCL ER (OSM) 36 MG PO TBCR: 36.0000 mg | EXTENDED_RELEASE_TABLET | ORAL | Status: DC

## 2014-06-07 MED ORDER — OXCARBAZEPINE 600 MG PO TABS: 1200.0000 mg | ORAL_TABLET | Freq: Two times a day (BID) | ORAL | Status: DC

## 2014-06-07 NOTE — Progress Notes (Signed)
   [facility]  Cody Barnett 12/28/1960  Date: 06/07/2014   Subjective: Patient reports that he has been well and healthy since his last visit and is very pleased that his HgbA1c is now down to 7! HE states he is actually testing his blood sugars now. He enjoyed [place] with his family. Appetite and sleep are good.  Filed Vitals:   06/07/14 1124  BP: 148/78  Pulse: 93    Mental Status Examination  Appearance: neatly groomed Alert: Yes Attention: good  Cooperative: Yes Eye Contact: Good Speech: clear and goal directed. Psychomotor Activity: Normal Memory/Concentration: fair Oriented: person, place and time/date Mood: Euthymic Affect: Congruent Thought Processes and Associations: Goal Directed, Linear and Logical Fund of Knowledge: Good Thought Content: WDL, Denies SI/HI or AVH. Insight: Good Judgement: Good  Diagnosis: ADD                     Bipolar  Treatment Plan:  1. Medication management as written.     Risks, benefits, side effects, cost and interactions have been discussed with this patient. All questions have been answered to the patient's satisfaction. 2. Out patient individual therapy is not indicated at this time. 3. Routine health maintenance as needed. This patient's PCP is Dr. Eppie GibsonMetheny 4. Consults as needed. 5. Regular exercise 20-30 minutes 3 x a week.   Rona RavensNeil T. Lianny Molter RPAC 11:35 AM 06/07/2014

## 2014-07-03 ENCOUNTER — Other Ambulatory Visit: Payer: Self-pay | Admitting: Family Medicine

## 2014-07-18 ENCOUNTER — Telehealth (HOSPITAL_COMMUNITY): Payer: Self-pay

## 2014-07-18 DIAGNOSIS — F909 Attention-deficit hyperactivity disorder, unspecified type: Secondary | ICD-10-CM

## 2014-07-18 DIAGNOSIS — F3181 Bipolar II disorder: Secondary | ICD-10-CM

## 2014-07-20 MED ORDER — METHYLPHENIDATE HCL ER (OSM) 36 MG PO TBCR: 36.0000 mg | EXTENDED_RELEASE_TABLET | ORAL | Status: DC

## 2014-07-20 NOTE — Telephone Encounter (Signed)
Requested refill on Concerta. 1 rx #[id number]. Rona RavensNeil T. Marillyn Goren RPAC 1:16 PM 07/20/2014

## 2014-08-09 ENCOUNTER — Other Ambulatory Visit: Payer: Self-pay | Admitting: Family Medicine

## 2014-08-31 ENCOUNTER — Telehealth (HOSPITAL_COMMUNITY): Payer: Self-pay

## 2014-08-31 DIAGNOSIS — F909 Attention-deficit hyperactivity disorder, unspecified type: Secondary | ICD-10-CM

## 2014-08-31 MED ORDER — METHYLPHENIDATE HCL ER (OSM) 36 MG PO TBCR: 36.0000 mg | EXTENDED_RELEASE_TABLET | ORAL | Status: DC

## 2014-08-31 NOTE — Telephone Encounter (Signed)
PT needs a refill for Concerta . Will p/u on Monday

## 2014-08-31 NOTE — Telephone Encounter (Signed)
Lloyd Huger on post call day. Filling Concerta for her client.

## 2014-09-05 ENCOUNTER — Encounter: Payer: Self-pay | Admitting: Family Medicine

## 2014-09-05 ENCOUNTER — Ambulatory Visit (INDEPENDENT_AMBULATORY_CARE_PROVIDER_SITE_OTHER): Payer: PRIVATE HEALTH INSURANCE | Admitting: Family Medicine

## 2014-09-05 VITALS — BP 130/66 | HR 80 | Wt 318.0 lb

## 2014-09-05 DIAGNOSIS — Z6841 Body Mass Index (BMI) 40.0 and over, adult: Secondary | ICD-10-CM

## 2014-09-05 DIAGNOSIS — E119 Type 2 diabetes mellitus without complications: Secondary | ICD-10-CM

## 2014-09-05 DIAGNOSIS — Z5181 Encounter for therapeutic drug level monitoring: Secondary | ICD-10-CM

## 2014-09-05 DIAGNOSIS — I10 Essential (primary) hypertension: Secondary | ICD-10-CM

## 2014-09-05 DIAGNOSIS — E785 Hyperlipidemia, unspecified: Secondary | ICD-10-CM

## 2014-09-05 DIAGNOSIS — Z23 Encounter for immunization: Secondary | ICD-10-CM

## 2014-09-05 NOTE — Assessment & Plan Note (Addendum)
A1c is up just a little at 7.1. Previous was 7.0. We discussed that his goal is actually under 7. He admits he has not been exercising or eating the best. That he's been eating out a lot. We discussed really working on diet and exercise are the next 3 months to try to get his A1c under 7. I really think he could be very successful at this. Encouraged him to establish routine. He says he has a plan in place. If not at goal at followup then we will need to adjust his medication. Has eye exam scheduled for later this month. F/U in 3 months.  Encourage more regular exercise . On aspirin and a statin.

## 2014-09-05 NOTE — Assessment & Plan Note (Signed)
Well-controlled. Continue current regimen. Due for CMP.

## 2014-09-05 NOTE — Progress Notes (Addendum)
   Subjective:    Patient ID: Cody Barnett, male    DOB: 02-08-1961, 53 y.o.   MRN: [id number]  HPI Diabetes - no hypoglycemic events. No wounds or sores that are not healing well. No increased thirst or urination. Not checking glucose at home. Taking medications as prescribed without any side effects. As some dry mouth. Not exercising regularly.    Hypertension- Pt denies chest pain, SOB, dizziness, or heart palpitations.  Taking meds as directed w/o problems.  Denies medication side effects.    Hyperlipidemia - on pravastain.  Last lipid check was over a year ago. Was ordered again in March but he didn't go.    Review of Systems     Objective:   Physical Exam  Constitutional: He is oriented to person, place, and time. He appears well-developed and well-nourished.  HENT:  Head: Normocephalic and atraumatic.  Cardiovascular: Normal rate, regular rhythm and normal heart sounds.   2/6 SEM, high pitched. Beast heard at the left sternal border.   Pulmonary/Chest: Effort normal and breath sounds normal.  Neurological: He is alert and oriented to person, place, and time.  Skin: Skin is warm and dry.  Psychiatric: He has a normal mood and affect. His behavior is normal.          Assessment & Plan:  offered flu vaccine.   He also requested that we check a Trileptal level so that he doesn't have to do it later with a psychiatrist. This was added to his lab work.  Heart murmur-he does have a history of aortic stenosis. Uses last echo was probably about 10 years ago. I encouraged him to consider letting us repeat this just to make sure there's not any significant changes. He says he will call when he is able to schedule this.

## 2014-09-05 NOTE — Assessment & Plan Note (Signed)
Well overdue for lipids. Will repeat today. Adjust pravastatin as needed.

## 2014-09-05 NOTE — Assessment & Plan Note (Signed)
Discussed the importance of diet and exercise. Hhe is working on a plan to start a regular exercise routine in the morning before he goes to work. I think this is fantastic. Also discussed cutting back on eating out.

## 2014-09-06 ENCOUNTER — Ambulatory Visit (HOSPITAL_COMMUNITY): Payer: Self-pay | Admitting: Physician Assistant

## 2014-09-13 ENCOUNTER — Ambulatory Visit (INDEPENDENT_AMBULATORY_CARE_PROVIDER_SITE_OTHER): Payer: PRIVATE HEALTH INSURANCE | Admitting: Physician Assistant

## 2014-09-13 ENCOUNTER — Other Ambulatory Visit: Payer: Self-pay | Admitting: Family Medicine

## 2014-09-13 VITALS — BP 159/111 | HR 67 | Ht 71.0 in | Wt 318.0 lb

## 2014-09-13 DIAGNOSIS — F3131 Bipolar disorder, current episode depressed, mild: Secondary | ICD-10-CM

## 2014-09-13 DIAGNOSIS — F3181 Bipolar II disorder: Secondary | ICD-10-CM

## 2014-09-13 DIAGNOSIS — F909 Attention-deficit hyperactivity disorder, unspecified type: Secondary | ICD-10-CM

## 2014-09-13 DIAGNOSIS — F319 Bipolar disorder, unspecified: Secondary | ICD-10-CM

## 2014-09-13 MED ORDER — OXCARBAZEPINE 600 MG PO TABS: 1200.0000 mg | ORAL_TABLET | Freq: Two times a day (BID) | ORAL | Status: AC

## 2014-09-13 MED ORDER — METHYLPHENIDATE HCL ER (OSM) 36 MG PO TBCR: 36.0000 mg | EXTENDED_RELEASE_TABLET | ORAL | Status: DC

## 2014-09-13 NOTE — Patient Instructions (Signed)
1. Take medication as directed. 2. Follow up 4-6 weeks.

## 2014-09-13 NOTE — Progress Notes (Signed)
   [facility] Follow-up Outpatient Visit  Cody Barnett 1961-06-08  Date: 09/13/2014   Subjective: Patient is in to follow up on his ADD and Bipolar disorder. He notes that he has not been able to get his Concerta because his driver's license has expired and the pharmacy won't fill it without a valid ID. He has been without it for several days. He states his wife is going to pick it up today.  He states he is about the same. One set of stressors has been exchanged for another, but he feels that he is coping pretty well. He and his wife just recently received a large sum of money due to a class action suit and he notes that there is some stress with making decisions on how to use this money. He denies SI/HI or AVH, no depression.  There were no vitals filed for this visit.  Mental Status Examination  Appearance: neatly groomed Alert: Yes Attention: good  Cooperative: Yes Eye Contact: Good Speech: clear and goal directed. Psychomotor Activity: increased and restless due to lack of medication. Memory/Concentration: fair Oriented: person, place and time/date Mood: Euthymic Affect: Congruent Thought Processes and Associations: Goal Directed, Linear and Logical Fund of Knowledge: Good Thought Content: WDL, Denies SI/HI or AVH. Insight: Good Judgement: Good  Diagnosis: ADD                     Bipolar  Treatment Plan:  1. Medication management as written x 1 month.  Patient is aware he will need to come in for prescriptions and to follow up in 4-6 weeks to re-evaluate for possible mania vs hypo mania.     Risks, benefits, side effects, cost and interactions have been discussed with this patient. All questions have been answered to the patient's satisfaction. 2. Out patient individual therapy is not indicated at this time. 3. Routine health maintenance as needed. This patient's PCP is Dr. Eppie Gibson 4. Consults as needed. 5. Regular exercise 20-30 minutes 3 x a  week.   Cody Barnett. Helaine Yackel RPAC 10:36 AM 09/13/2014

## 2014-10-11 ENCOUNTER — Ambulatory Visit (HOSPITAL_COMMUNITY): Payer: Self-pay | Admitting: Physician Assistant

## 2014-10-18 ENCOUNTER — Ambulatory Visit (INDEPENDENT_AMBULATORY_CARE_PROVIDER_SITE_OTHER): Payer: PRIVATE HEALTH INSURANCE | Admitting: Physician Assistant

## 2014-10-18 ENCOUNTER — Encounter (INDEPENDENT_AMBULATORY_CARE_PROVIDER_SITE_OTHER): Payer: Self-pay

## 2014-10-18 VITALS — BP 157/76 | HR 72 | Ht 71.0 in | Wt 321.0 lb

## 2014-10-18 DIAGNOSIS — F909 Attention-deficit hyperactivity disorder, unspecified type: Secondary | ICD-10-CM

## 2014-10-18 DIAGNOSIS — F319 Bipolar disorder, unspecified: Secondary | ICD-10-CM

## 2014-10-18 DIAGNOSIS — F3181 Bipolar II disorder: Secondary | ICD-10-CM

## 2014-10-18 MED ORDER — METHYLPHENIDATE HCL ER (OSM) 36 MG PO TBCR: 36.0000 mg | EXTENDED_RELEASE_TABLET | ORAL | Status: AC

## 2014-10-18 MED ORDER — METHYLPHENIDATE HCL ER (OSM) 36 MG PO TBCR: 36.0000 mg | EXTENDED_RELEASE_TABLET | ORAL | Status: DC

## 2014-10-18 NOTE — Progress Notes (Signed)
   [facility]  Cheree DittoGregory W Schrum August 03, 1961  Date: 10/18/2014   Subjective: Patient is in today to follow up. He is doing well with the exception of his teenage son. The teen is ODD and has conduct disorder as well. Stealing continues to be an issue with him. The entire family feels as if they are being held hostage by this young man's behavior.     Earl LitesGregory is trying to be supportive and continues to try and set boundaries. The patient is other wise sleeping OK, and continues to eat well. Mental Status Examination  Appearance: neatly groomed Alert: Yes Attention: good  Cooperative: Yes Eye Contact: Good Speech: clear and goal directed. Psychomotor Activity: increased and restless due to lack of medication. Memory/Concentration: fair Oriented: person, place and time/date Mood: Euthymic Affect: Congruent Thought Processes and Associations: Goal Directed, Linear and Logical Fund of Knowledge: Good Thought Content: WDL, Denies SI/HI or AVH. Insight: Good Judgement: Good  Diagnosis: ADD                     Bipolar  Treatment Plan:  1. Medication management as written x 3 months  Patient is aware he will need to come in for prescriptions and to follow up in 3months 2. Out patient individual therapy is not indicated at this time. 3. Routine health maintenance as needed. This patient's PCP is Dr. Eppie GibsonMetheny 4. Consults as needed. 5. Regular exercise 20-30 minutes 3 x a week.   Rona RavensNeil T. Gelila Well RPAC 3:25 PM 10/18/2014

## 2014-10-18 NOTE — Patient Instructions (Signed)
1. Take medications as written. 2. Follow up in 3 months. 3. Safe guard your prescriptions, as lost Rxs can not be refilled before 3 months. 4. Call with questions or problems. 5. Have a good holiday season.

## 2014-10-30 ENCOUNTER — Ambulatory Visit (HOSPITAL_COMMUNITY): Payer: Self-pay | Admitting: Physician Assistant

## 2014-11-11 ENCOUNTER — Other Ambulatory Visit: Payer: Self-pay | Admitting: Family Medicine

## 2014-12-04 ENCOUNTER — Other Ambulatory Visit: Payer: Self-pay | Admitting: Family Medicine

## 2014-12-05 ENCOUNTER — Ambulatory Visit: Payer: Self-pay | Admitting: Family Medicine

## 2014-12-05 DIAGNOSIS — Z0289 Encounter for other administrative examinations: Secondary | ICD-10-CM

## 2015-01-15 ENCOUNTER — Other Ambulatory Visit: Payer: Self-pay | Admitting: Family Medicine

## 2015-01-18 ENCOUNTER — Ambulatory Visit (HOSPITAL_COMMUNITY): Payer: Self-pay | Admitting: Physician Assistant

## 2015-01-27 ENCOUNTER — Other Ambulatory Visit: Payer: Self-pay | Admitting: Family Medicine

## 2015-01-28 NOTE — Telephone Encounter (Signed)
I left a detailed message for patient to call and schedule a follow up visit for DM.

## 2015-03-09 ENCOUNTER — Other Ambulatory Visit: Payer: Self-pay | Admitting: Family Medicine

## 2015-03-11 NOTE — Telephone Encounter (Signed)
Spoke with patient, advised we need to see him for an appointment. Patient states he is unable to access his calendar right now and will call back by the end of the day to set up an appointment. Pt advises he has been out of his Victoza since Friday (03/08/15). Advised patient I will send over 3 pens to last until he comes in for an appointment. Verbalized understanding.

## 2015-04-04 ENCOUNTER — Other Ambulatory Visit: Payer: Self-pay | Admitting: Family Medicine

## 2015-04-04 NOTE — Telephone Encounter (Signed)
[facility] and referrals, will no longer see Dr. Linford ArnoldMetheney.

## 2015-05-22 ENCOUNTER — Other Ambulatory Visit: Payer: Self-pay | Admitting: Family Medicine

## 2015-06-16 ENCOUNTER — Other Ambulatory Visit: Payer: Self-pay | Admitting: Family Medicine

## 2015-07-15 ENCOUNTER — Other Ambulatory Visit: Payer: Self-pay | Admitting: Family Medicine

## 2015-07-16 NOTE — Telephone Encounter (Signed)
Patient needs appointment with Provider for any further refills. 

## 2015-08-02 ENCOUNTER — Other Ambulatory Visit: Payer: Self-pay | Admitting: Family Medicine
